# Patient Record
Sex: Female | Born: 2000 | Race: White | Hispanic: No | Smoking: Former smoker
Health system: Southern US, Community
[De-identification: ages and names within clinical notes are randomized; demographics above are authoritative.]

## PROBLEM LIST (undated history)

## (undated) ENCOUNTER — Inpatient Hospital Stay (HOSPITAL_COMMUNITY): Payer: Self-pay

## (undated) ENCOUNTER — Inpatient Hospital Stay: Payer: Self-pay

## (undated) DIAGNOSIS — L309 Dermatitis, unspecified: Secondary | ICD-10-CM

## (undated) DIAGNOSIS — N1330 Unspecified hydronephrosis: Secondary | ICD-10-CM

## (undated) DIAGNOSIS — L509 Urticaria, unspecified: Secondary | ICD-10-CM

## (undated) HISTORY — DX: Dermatitis, unspecified: L30.9

## (undated) HISTORY — DX: Urticaria, unspecified: L50.9

## (undated) HISTORY — PX: FOOT SURGERY: SHX648

---
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
## 2019-02-25 ENCOUNTER — Other Ambulatory Visit: Payer: Self-pay | Admitting: Adult Health

## 2019-02-25 MED ORDER — MEDROXYPROGESTERONE ACETATE 150 MG/ML IM SUSP: 150.0000 mg | INTRAMUSCULAR | 3 refills | Status: DC

## 2019-05-25 ENCOUNTER — Encounter: Payer: Self-pay | Admitting: Adult Health

## 2019-05-25 ENCOUNTER — Other Ambulatory Visit: Payer: Self-pay

## 2019-05-25 ENCOUNTER — Ambulatory Visit (INDEPENDENT_AMBULATORY_CARE_PROVIDER_SITE_OTHER): Payer: Medicaid Other | Admitting: Adult Health

## 2019-05-25 VITALS — Ht 61.0 in

## 2019-05-25 DIAGNOSIS — Z202 Contact with and (suspected) exposure to infections with a predominantly sexual mode of transmission: Secondary | ICD-10-CM

## 2019-05-25 MED ORDER — AZITHROMYCIN 250 MG PO TABS: ORAL_TABLET | ORAL | 0 refills | Status: DC

## 2019-05-25 NOTE — Addendum Note (Signed)
Addended by: Johnna Acosta on: 05/25/2019 04:16 PM   Modules accepted: Orders

## 2019-05-25 NOTE — Progress Notes (Addendum)
[facility] [address] [place], [place] [address]  Internal MEDICINE  Telephone Visit  Patient Name: Jamie Escobar  [id number]  [id number]  Date of Service: 05/25/2019  I connected with the patient at 242 by telephone and verified the patients identity using two identifiers.   I discussed the limitations, risks, security and privacy concerns of performing an evaluation and management service by telephone and the availability of in person appointments. I also discussed with the patient that there may be a patient responsible charge related to the service.  The patient expressed understanding and agrees to proceed.    Chief Complaint  Patient presents with  . Telephone Assessment  . Telephone Screen  . SEXUALLY TRANSMITTED DISEASE    HPI  Pt seen via telephone. She reports her sexual partner found out today that he tested positive for chlamydia.  He is being treated today, and she would like treatment also.  She denies any symptoms, and reports only one partner.     Current Medication: Outpatient Encounter Medications as of 05/25/2019  Medication Sig  . Cetirizine HCl 10 MG CAPS Take 1 capsule (10 mg total) by mouth daily.  . clobetasol ointment (TEMOVATE) 0.05 % Apply 1 application topically 2 (two) times daily.  . fluticasone (FLONASE) 50 MCG/ACT nasal spray Place 2 sprays into both nostrils daily.  . medroxyPROGESTERone (DEPO-PROVERA) 150 MG/ML injection Inject 1 mL (150 mg total) into the muscle every 3 (three) months.  . pantoprazole (PROTONIX) 40 MG tablet Take 1 tablet (40 mg total) by mouth 2 (two) times daily.  Marland Kitchen azithromycin (ZITHROMAX) 250 MG tablet TAKE 1000MG  PO ONCE  . [DISCONTINUED] oseltamivir (TAMIFLU) 75 MG capsule Take 1 capsule (75 mg total) by mouth daily. (Patient not taking: Reported on 05/25/2019)   No facility-administered encounter medications on file as of 05/25/2019.    Surgical History: Past Surgical History:  Procedure Laterality  Date  . FOOT SURGERY Right     Medical History: Past Medical History:  Diagnosis Date  . Hydronephrosis     Family History: Family History  Problem Relation Age of Onset  . Cancer Father        brain   . Diabetes Maternal Grandmother   . Heart disease Maternal Grandmother     Social History   Socioeconomic History  . Marital status: Single    Spouse name: Not on file  . Number of children: Not on file  . Years of education: Not on file  . Highest education level: Not on file  Occupational History  . Not on file  Tobacco Use  . Smoking status: Current Every Day Smoker  . Smokeless tobacco: Never Used  Substance and Sexual Activity  . Alcohol use: No  . Drug use: Not Currently    Types: Marijuana  . Sexual activity: Not Currently    Birth control/protection: None, Implant  Other Topics Concern  . Not on file  Social History Narrative  . Not on file   Social Determinants of Health   Financial Resource Strain:   . Difficulty of Paying Living Expenses: Not on file  Food Insecurity:   . Worried About 05/27/2019 in the Last Year: Not on file  . Ran Out of Food in the Last Year: Not on file  Transportation Needs:   . Lack of Transportation (Medical): Not on file  . Lack of Transportation (Non-Medical): Not on file  Physical Activity:   . Days of Exercise per Week: Not on  file  . Minutes of Exercise per Session: Not on file  Stress:   . Feeling of Stress : Not on file  Social Connections:   . Frequency of Communication with Friends and Family: Not on file  . Frequency of Social Gatherings with Friends and Family: Not on file  . Attends Religious Services: Not on file  . Active Member of Clubs or Organizations: Not on file  . Attends Archivist Meetings: Not on file  . Marital Status: Not on file  Intimate Partner Violence:   . Fear of Current or Ex-Partner: Not on file  . Emotionally Abused: Not on file  . Physically Abused: Not on file   . Sexually Abused: Not on file      Review of Systems  Constitutional: Negative for chills, fatigue and unexpected weight change.  HENT: Negative for congestion, rhinorrhea, sneezing and sore throat.   Eyes: Negative for photophobia, pain and redness.  Respiratory: Negative for cough, chest tightness and shortness of breath.   Cardiovascular: Negative for chest pain and palpitations.  Gastrointestinal: Negative for abdominal pain, constipation, diarrhea, nausea and vomiting.  Endocrine: Negative.   Genitourinary: Negative for dysuria and frequency.  Musculoskeletal: Negative for arthralgias, back pain, joint swelling and neck pain.  Skin: Negative for rash.  Allergic/Immunologic: Negative.   Neurological: Negative for tremors and numbness.  Hematological: Negative for adenopathy. Does not bruise/bleed easily.  Psychiatric/Behavioral: Negative for behavioral problems and sleep disturbance. The patient is not nervous/anxious.     Vital Signs: Ht 5\' 1"  (1.549 m)   BMI 24.56 kg/m    Observation/Objective:  Well sounding, NAD noted.   Assessment/Plan: 1. Exposure to sexually transmitted disease (STD) Pt will take 1 Gram PO azithromycin.  Encouraged no sexual activity for two weeks.  Offered retesting in a few weeks.  - azithromycin (ZITHROMAX) 250 MG tablet; TAKE 1000MG  PO ONCE  Dispense: 4 tablet; Refill: 0 RX canceled and sent to [facility] on [address] in [place]  General Counseling: Jamie Escobar verbalizes understanding of the findings of today's phone visit and agrees with plan of treatment. I have discussed any further diagnostic evaluation that may be needed or ordered today. We also reviewed her medications today. she has been encouraged to call the office with any questions or concerns that should arise related to todays visit.    No orders of the defined types were placed in this encounter.   Meds ordered this encounter  Medications  . azithromycin (ZITHROMAX) 250  MG tablet    Sig: TAKE 1000MG  PO ONCE    Dispense:  4 tablet    Refill:  0    Time spent:15 Minutes    Orson Gear AGNP-C Internal medicine

## 2019-06-28 ENCOUNTER — Ambulatory Visit (INDEPENDENT_AMBULATORY_CARE_PROVIDER_SITE_OTHER): Payer: Medicaid Other | Admitting: Adult Health

## 2019-06-28 ENCOUNTER — Other Ambulatory Visit: Payer: Self-pay

## 2019-06-28 ENCOUNTER — Encounter: Payer: Self-pay | Admitting: Adult Health

## 2019-06-28 VITALS — BP 104/61 | HR 78 | Temp 97.4°F | Resp 16 | Ht 61.0 in | Wt 143.2 lb

## 2019-06-28 DIAGNOSIS — Z3042 Encounter for surveillance of injectable contraceptive: Secondary | ICD-10-CM | POA: Diagnosis not present

## 2019-06-28 DIAGNOSIS — Z202 Contact with and (suspected) exposure to infections with a predominantly sexual mode of transmission: Secondary | ICD-10-CM | POA: Diagnosis not present

## 2019-06-28 MED ORDER — MEDROXYPROGESTERONE ACETATE 150 MG/ML IM SUSP: 150.0000 mg | INTRAMUSCULAR | 3 refills | Status: DC

## 2019-06-28 NOTE — Progress Notes (Signed)
[facility] [address] [place], [place] [address]  Internal MEDICINE  Office Visit Note  Patient Name: Jamie Escobar  [id number]  [id number]  Date of Service: 06/28/2019  Chief Complaint  Patient presents with  . Follow-up    HPI  Pt is here for follow up on chlamydia infection.  She took azithromycin and will be rechecked at this time.  She denies any symptoms or issues at this time.  Will order recheck today.     Current Medication: Outpatient Encounter Medications as of 06/28/2019  Medication Sig  . azithromycin (ZITHROMAX) 250 MG tablet Take 1000mg  (4 tablets) PO, once.  . Cetirizine HCl 10 MG CAPS Take 1 capsule (10 mg total) by mouth daily.  . clobetasol ointment (TEMOVATE) 0.05 % Apply 1 application topically 2 (two) times daily.  . fluticasone (FLONASE) 50 MCG/ACT nasal spray Place 2 sprays into both nostrils daily.  . medroxyPROGESTERone (DEPO-PROVERA) 150 MG/ML injection Inject 1 mL (150 mg total) into the muscle every 3 (three) months.  . pantoprazole (PROTONIX) 40 MG tablet Take 1 tablet (40 mg total) by mouth 2 (two) times daily.  . [DISCONTINUED] medroxyPROGESTERone (DEPO-PROVERA) 150 MG/ML injection Inject 1 mL (150 mg total) into the muscle every 3 (three) months.   No facility-administered encounter medications on file as of 06/28/2019.    Surgical History: Past Surgical History:  Procedure Laterality Date  . FOOT SURGERY Right     Medical History: Past Medical History:  Diagnosis Date  . Hydronephrosis     Family History: Family History  Problem Relation Age of Onset  . Cancer Father        brain   . Diabetes Maternal Grandmother   . Heart disease Maternal Grandmother     Social History   Socioeconomic History  . Marital status: Single    Spouse name: Not on file  . Number of children: Not on file  . Years of education: Not on file  . Highest education level: Not on file  Occupational History  . Not on file  Tobacco  Use  . Smoking status: Current Every Day Smoker    Types: E-cigarettes  . Smokeless tobacco: Never Used  Substance and Sexual Activity  . Alcohol use: No  . Drug use: Not Currently    Types: Marijuana  . Sexual activity: Not Currently    Birth control/protection: None, Implant  Other Topics Concern  . Not on file  Social History Narrative  . Not on file   Social Determinants of Health   Financial Resource Strain:   . Difficulty of Paying Living Expenses: Not on file  Food Insecurity:   . Worried About 06/30/2019 in the Last Year: Not on file  . Ran Out of Food in the Last Year: Not on file  Transportation Needs:   . Lack of Transportation (Medical): Not on file  . Lack of Transportation (Non-Medical): Not on file  Physical Activity:   . Days of Exercise per Week: Not on file  . Minutes of Exercise per Session: Not on file  Stress:   . Feeling of Stress : Not on file  Social Connections:   . Frequency of Communication with Friends and Family: Not on file  . Frequency of Social Gatherings with Friends and Family: Not on file  . Attends Religious Services: Not on file  . Active Member of Clubs or Organizations: Not on file  . Attends Programme researcher, broadcasting/film/video Meetings: Not on file  .  Marital Status: Not on file  Intimate Partner Violence:   . Fear of Current or Ex-Partner: Not on file  . Emotionally Abused: Not on file  . Physically Abused: Not on file  . Sexually Abused: Not on file      Review of Systems  Constitutional: Negative for chills, fatigue and unexpected weight change.  HENT: Negative for congestion, rhinorrhea, sneezing and sore throat.   Eyes: Negative for photophobia, pain and redness.  Respiratory: Negative for cough, chest tightness and shortness of breath.   Cardiovascular: Negative for chest pain and palpitations.  Gastrointestinal: Negative for abdominal pain, constipation, diarrhea, nausea and vomiting.  Endocrine: Negative.   Genitourinary:  Negative for dysuria and frequency.  Musculoskeletal: Negative for arthralgias, back pain, joint swelling and neck pain.  Skin: Negative for rash.  Allergic/Immunologic: Negative.   Neurological: Negative for tremors and numbness.  Hematological: Negative for adenopathy. Does not bruise/bleed easily.  Psychiatric/Behavioral: Negative for behavioral problems and sleep disturbance. The patient is not nervous/anxious.     Vital Signs: BP 104/61   Pulse 78   Temp (!) 97.4 F (36.3 C)   Resp 16   Ht 5\' 1"  (1.549 m)   Wt 143 lb 3.2 oz (65 kg)   SpO2 99%   BMI 27.06 kg/m    Physical Exam Vitals and nursing note reviewed.  Constitutional:      General: She is not in acute distress.    Appearance: She is well-developed. She is not diaphoretic.  HENT:     Head: Normocephalic and atraumatic.     Mouth/Throat:     Pharynx: No oropharyngeal exudate.  Eyes:     Pupils: Pupils are equal, round, and reactive to light.  Neck:     Thyroid: No thyromegaly.     Vascular: No JVD.     Trachea: No tracheal deviation.  Cardiovascular:     Rate and Rhythm: Normal rate and regular rhythm.     Heart sounds: Normal heart sounds. No murmur. No friction rub. No gallop.   Pulmonary:     Effort: Pulmonary effort is normal. No respiratory distress.     Breath sounds: Normal breath sounds. No wheezing or rales.  Chest:     Chest wall: No tenderness.  Abdominal:     Palpations: Abdomen is soft.     Tenderness: There is no abdominal tenderness. There is no guarding.  Musculoskeletal:        General: Normal range of motion.     Cervical back: Normal range of motion and neck supple.  Lymphadenopathy:     Cervical: No cervical adenopathy.  Skin:    General: Skin is warm and dry.  Neurological:     Mental Status: She is alert and oriented to person, place, and time.     Cranial Nerves: No cranial nerve deficit.  Psychiatric:        Behavior: Behavior normal.        Thought Content: Thought  content normal.        Judgment: Judgment normal.    Assessment/Plan: 1. Exposure to sexually transmitted disease (STD) Recheck urine for chlamydia  - Chlamydia/Gonococcus/Trichomonas, NAA  2. Encounter for surveillance of injectable contraceptive Refilled Depo-provera at this time.  - medroxyPROGESTERone (DEPO-PROVERA) 150 MG/ML injection; Inject 1 mL (150 mg total) into the muscle every 3 (three) months.  Dispense: 1 mL; Refill: 3  General Counseling: Kinley verbalizes understanding of the findings of todays visit and agrees with plan of treatment. I have discussed  any further diagnostic evaluation that may be needed or ordered today. We also reviewed her medications today. she has been encouraged to call the office with any questions or concerns that should arise related to todays visit.    Orders Placed This Encounter  Procedures  . Chlamydia/Gonococcus/Trichomonas, NAA    Meds ordered this encounter  Medications  . medroxyPROGESTERone (DEPO-PROVERA) 150 MG/ML injection    Sig: Inject 1 mL (150 mg total) into the muscle every 3 (three) months.    Dispense:  1 mL    Refill:  3    Time spent: 15 Minutes   This patient was seen by Blima Ledger AGNP-C in Collaboration with Dr Lyndon Code as a part of collaborative care agreement     Johnna Acosta AGNP-C Internal medicine

## 2019-07-01 LAB — CHLAMYDIA/GONOCOCCUS/TRICHOMONAS, NAA
Chlamydia by NAA: NEGATIVE
Gonococcus by NAA: NEGATIVE
Trich vag by NAA: NEGATIVE

## 2019-12-06 ENCOUNTER — Other Ambulatory Visit: Payer: Self-pay

## 2019-12-06 ENCOUNTER — Emergency Department
Admission: EM | Admit: 2019-12-06 | Discharge: 2019-12-06 | Disposition: A | Discharge status: Home / Self Care | Payer: Medicaid Other | Attending: Emergency Medicine | Admitting: Emergency Medicine

## 2019-12-06 ENCOUNTER — Encounter: Payer: Self-pay | Admitting: Emergency Medicine

## 2019-12-06 DIAGNOSIS — T43225A Adverse effect of selective serotonin reuptake inhibitors, initial encounter: Secondary | ICD-10-CM | POA: Diagnosis not present

## 2019-12-06 DIAGNOSIS — T7840XA Allergy, unspecified, initial encounter: Secondary | ICD-10-CM | POA: Diagnosis not present

## 2019-12-06 DIAGNOSIS — Z79899 Other long term (current) drug therapy: Secondary | ICD-10-CM | POA: Diagnosis not present

## 2019-12-06 DIAGNOSIS — F17210 Nicotine dependence, cigarettes, uncomplicated: Secondary | ICD-10-CM | POA: Insufficient documentation

## 2019-12-06 DIAGNOSIS — L299 Pruritus, unspecified: Secondary | ICD-10-CM | POA: Diagnosis not present

## 2019-12-06 DIAGNOSIS — R21 Rash and other nonspecific skin eruption: Secondary | ICD-10-CM | POA: Diagnosis present

## 2019-12-06 DIAGNOSIS — L509 Urticaria, unspecified: Secondary | ICD-10-CM | POA: Insufficient documentation

## 2019-12-06 MED ORDER — PREDNISONE 50 MG PO TABS: 50.0000 mg | ORAL_TABLET | Freq: Every day | ORAL | 0 refills | Status: DC

## 2019-12-06 NOTE — ED Provider Notes (Signed)
[facility] Emergency Department Provider Note   ____________________________________________    I have reviewed the triage vital signs and the nursing notes.   HISTORY  Chief Complaint Rash     HPI Jamie Escobar is a 19 y.o. female with history as noted below who was started on Zoloft last week but developed an itchy rash primarily to her hands the day afterwards.  She has since discontinued the medication.  No history of allergic reactions in the past.  She reports continued itching diffusely with varying red rash consistent with hives.  Her lip felt swollen yesterday, better now.  No difficulty breathing.  Past Medical History:  Diagnosis Date  . Hydronephrosis     Patient Active Problem List   Diagnosis Date Noted  . Gastroesophageal reflux disease 10/23/2017  . Menorrhagia with irregular cycle 10/23/2017  . Skin lesion 10/23/2017  . Nausea 07/07/2016  . Annual physical exam 01/29/2016    Past Surgical History:  Procedure Laterality Date  . FOOT SURGERY Right     Prior to Admission medications   Medication Sig Start Date End Date Taking? Authorizing Provider  Cetirizine HCl 10 MG CAPS Take 1 capsule (10 mg total) by mouth daily. 11/10/18   Kendell Bane, NP  clobetasol ointment (TEMOVATE) 1.85 % Apply 1 application topically 2 (two) times daily. 12/01/18   Kendell Bane, NP  fluticasone (FLONASE) 50 MCG/ACT nasal spray Place 2 sprays into both nostrils daily. 11/10/18   Kendell Bane, NP  medroxyPROGESTERone (DEPO-PROVERA) 150 MG/ML injection Inject 1 mL (150 mg total) into the muscle every 3 (three) months. 06/28/19   Kendell Bane, NP  pantoprazole (PROTONIX) 40 MG tablet Take 1 tablet (40 mg total) by mouth 2 (two) times daily. 07/02/18   Kendell Bane, NP  predniSONE (DELTASONE) 50 MG tablet Take 1 tablet (50 mg total) by mouth daily with breakfast. 12/06/19   Lavonia Drafts, MD     Allergies Patient has no known  allergies.  Family History  Problem Relation Age of Onset  . Cancer Father        brain   . Diabetes Maternal Grandmother   . Heart disease Maternal Grandmother     Social History Social History   Tobacco Use  . Smoking status: Current Every Day Smoker    Types: E-cigarettes  . Smokeless tobacco: Never Used  Vaping Use  . Vaping Use: Some days  Substance Use Topics  . Alcohol use: No  . Drug use: Not Currently    Types: Marijuana    Review of Systems  Constitutional: No fever/chills  ENT: No throat swelling   Gastrointestinal: No nausea, no vomiting.    Musculoskeletal: Negative for joint swelling Skin: Positive for rash     ____________________________________________   PHYSICAL EXAM:  VITAL SIGNS: ED Triage Vitals  Enc Vitals Group     BP 12/06/19 0732 116/66     Pulse Rate 12/06/19 0732 80     Resp 12/06/19 0732 17     Temp 12/06/19 0732 98.4 F (36.9 C)     Temp Source 12/06/19 0732 Oral     SpO2 12/06/19 0732 98 %     Weight 12/06/19 0729 64.9 kg (143 lb)     Height 12/06/19 0729 1.549 m (5\' 1" )     Head Circumference --      Peak Flow --      Pain Score 12/06/19 0729 0     Pain Loc --  Pain Edu? --      Excl. in [place]? --      Constitutional: Alert and oriented. No acute distress. Pleasant and interactive  Nose: No congestion/rhinnorhea. Mouth/Throat: Mucous membranes are moist.  Pharynx normal, uvula normal Cardiovascular: Normal rate, regular rhythm.  Respiratory: Normal respiratory effort.  No retractions.  Musculoskeletal: No joint swelling Neurologic:  Normal speech and language. No gross focal neurologic deficits are appreciated.   Skin:  Skin is warm, dry and intact.  Urticaria noted   ____________________________________________   LABS (all labs ordered are listed, but only abnormal results are displayed)  Labs Reviewed - No data to  display ____________________________________________  EKG   ____________________________________________  RADIOLOGY   ____________________________________________   PROCEDURES  Procedure(s) performed: No  Procedures   Critical Care performed: No ____________________________________________   INITIAL IMPRESSION / ASSESSMENT AND PLAN / ED COURSE  Pertinent labs & imaging results that were available during my care of the patient were reviewed by me and considered in my medical decision making (see chart for details).  Patient presents with itchy rash as noted above.  Differential includes urticaria versus erythema multiforme versus drug rash  Most consistent with urticaria given pruritus and rapidly changing rash.  Will start prednisone burst, continue Benadryl Zyrtec   ____________________________________________   FINAL CLINICAL IMPRESSION(S) / ED DIAGNOSES  Final diagnoses:  Allergic reaction, initial encounter  Urticaria      NEW MEDICATIONS STARTED DURING THIS VISIT:  Discharge Medication List as of 12/06/2019  9:05 AM    START taking these medications   Details  predniSONE (DELTASONE) 50 MG tablet Take 1 tablet (50 mg total) by mouth daily with breakfast., Starting Tue 12/06/2019, Normal         Note:  This document was prepared using Dragon voice recognition software and may include unintentional dictation errors.   Lavonia Drafts, MD 12/06/19 1304

## 2019-12-06 NOTE — ED Notes (Signed)
See triage note  Presents with rash  States she started Zoloft last week  Developed rash couple days after  Stopping take meds on Sunday  conts to have rash/hives  Min swelling noted to lip  No resp issues noted

## 2019-12-06 NOTE — ED Triage Notes (Signed)
Pt reports rash/hives on her arms, legs and hands since Friday. Pt reports not sure what she is having a reaction to. Pt reports soaps, clothes, detergents or anything else. Pt reports she did start a new medication on Wednesday and her dr told her to stop taking it incase that was causing the reaction so she has not taken it in 2 days. Pt denies SOB or other concerns. Pt reports areas itch.

## 2020-01-31 NOTE — Progress Notes (Signed)
New Patient Note  RE: Jamie Escobar MRN: [id number] DOB: 07/28/2000 Date of Office Visit: 02/01/2020  Referring provider: Roderick PeeElmore, Kevin A, PA Primary care provider: Roderick PeeElmore, Kevin A, PA  Chief Complaint: Urticaria and Allergic Rhinitis   History of Present Illness: I had the pleasure of seeing Jamie Escobar for initial evaluation at the [facility] on 02/01/2020. She is a 19 y.o. female, who is referred here by Roderick PeeElmore, Kevin A, PA for the evaluation of hives.  Patient was started on a new anxiety medication (Zoloft) in July and 3 days afterwards she broke out in hives for 1-2 weeks.  She stopped Zoloft. She was treated with hydroxyzine for 5 days which helped.   She was then switched to Lexapro in August and broke out in a rash 2 days after taking it. Patient stopped the medication and took about 4 days for the rash to resolve with zyrtec.   Since then patient has not had anymore itching, rash or swelling.   The rash mainly occurred on her legs, feet, arms. Describes them as raised, itchy, red bumps. Individual rashes lasts about 15 minutes. No ecchymosis upon resolution. Associated symptoms include: lip swelling x 3 but no respiratory compromise. Suspected triggers are possibly the new medications. Denies any fevers, chills, foods, personal care products or recent infections. She has tried the following therapies: zyrtec and hydroxyzine with good benefit. Systemic steroids yes. Currently on zyrtec 10mg  daily.  Previous work up includes: none. Previous history of rash/hives: none.  Reviewed images on the phone - raised, papular erythematous rash on foot and buttocks noted.  12/06/2019 ER visit: "Jamie Escobar is a 19 y.o. female with history as noted below who was started on Zoloft last week but developed an itchy rash primarily to her hands the day afterwards.  She has since discontinued the medication.  No history of allergic reactions in the past.  She  reports continued itching diffusely with varying red rash consistent with hives.  Her lip felt swollen yesterday, better now.  No difficulty breathing."  Assessment and Plan: Jamie Escobar is a 19 y.o. female with: Drug reaction Patient started on Zoloft in July and 3 days afterwards broke out in pruritic rash lasting for 1 to 2 weeks.  Treated with hydroxyzine with good benefit.  She was then started on Lexapro in August and 2 days afterwards broke out in a similar pruritic rash as Zoloft.  She stopped Lexapro and the rash resolved within 5 days.  3 episodes of lip angioedema with no respiratory compromise.  Since she stopped her SSRI medications no additional pruritic rash or swelling.  No prior history of rash/hives/angioedema.  Discussed with patient that there are no "allergy" testing available for SSRI medications such as Zoloft and Lexapro.  Her clinical history suggests a non-IgE mediated adverse reaction to these 2 drugs.   Recommend to try a different class of medications that is not an SSRI to control her mood. No need for in office drug challenge as symptoms occurred a few days after initial ingestion.   If patient develops rashes or angioedema without taking SSRI medications then will need to do some additional work up at that time.  Chronic rhinitis Perennial rhinitis symptoms for the last 5 years with worsening in the spring.  Tried Zyrtec and Flonase with good benefit.  No previous ENT/allergy evaluation.  Patient would like testing for environmental allergies.  Today's skin testing showed: negative to indoor/outdoor allergens.  May use  over the counter antihistamines such as Zyrtec (cetirizine), Claritin (loratadine), Allegra (fexofenadine), or Xyzal (levocetirizine) daily as needed.  May use Flonase (fluticasone) nasal spray 1 spray per nostril twice a day as needed for nasal congestion.  Return if symptoms worsen or fail to improve.  Other allergy screening: Asthma: no Rhino  conjunctivitis: yes  She reports symptoms of nasal congestion, sore throat. Symptoms have been going on for 5 years. The symptoms are present all year around with worsening in spring. Anosmia: no. Headache: sometimes. She has used zyrtec, fluticasone with fair improvement in symptoms. Sinus infections: yes. Previous work up includes: none. Previous ENT evaluation: no. Previous sinus imaging: no. History of nasal polyps: no. History of reflux: not taking any medications for this.  Food allergy: no Medication allergy: no Hymenoptera allergy: no Eczema: yes History of recurrent infections suggestive of immunodeficency: no  Diagnostics: Skin Testing: Environmental allergy panel. Negative to indoor/outdoor allergens. Results discussed with patient/family.  Airborne Adult Perc - 02/01/20 1509    Time Antigen Placed 1509    Allergen Manufacturer Waynette Buttery    Location Back    Number of Test 59    Panel 1 Select    1. Control-Buffer 50% Glycerol Negative    2. Control-Histamine 1 mg/ml 2+    3. Albumin saline Negative    4. Bahia Negative    5. French Southern Territories Negative    6. Johnson Negative    7. Kentucky Blue Negative    8. Meadow Fescue Negative    9. Perennial Rye Negative    10. Sweet Vernal Negative    11. Timothy Negative    12. Cocklebur Negative    13. Burweed Marshelder Negative    14. Ragweed, short Negative    15. Ragweed, Giant Negative    16. Plantain,  English Negative    17. Lamb's Quarters Negative    18. Sheep Sorrell Negative    19. Rough Pigweed Negative    20. Marsh Elder, Rough Negative    21. Mugwort, Common Negative    22. Ash mix Negative    23. Birch mix Negative    24. Beech American Negative    25. Box, Elder Negative    26. Cedar, red Negative    27. Cottonwood, Guinea-Bissau Negative    28. Elm mix Negative    29. Hickory Negative    30. Maple mix Negative    31. Oak, Guinea-Bissau mix Negative    32. Pecan Pollen Negative    33. Pine mix Negative    34. Sycamore  Eastern Negative    35. Walnut, Black Pollen Negative    36. Alternaria alternata Negative    37. Cladosporium Herbarum Negative    38. Aspergillus mix Negative    39. Penicillium mix Negative    40. Bipolaris sorokiniana (Helminthosporium) Negative    41. Drechslera spicifera (Curvularia) Negative    42. Mucor plumbeus Negative    43. Fusarium moniliforme Negative    44. Aureobasidium pullulans (pullulara) Negative    45. Rhizopus oryzae Negative    46. Botrytis cinera Negative    47. Epicoccum nigrum Negative    48. Phoma betae Negative    49. Candida Albicans Negative    50. Trichophyton mentagrophytes Negative    51. Mite, D Farinae  5,000 AU/ml Negative    52. Mite, D Pteronyssinus  5,000 AU/ml Negative    53. Cat Hair 10,000 BAU/ml Negative    54.  Dog Epithelia Negative    55. Mixed Feathers  Negative    56. Horse Epithelia Negative    57. Cockroach, German Negative    58. Mouse Negative    59. Tobacco Leaf Negative           Past Medical History: Patient Active Problem List   Diagnosis Date Noted  . Drug reaction 02/01/2020  . Chronic rhinitis 02/01/2020  . Gastroesophageal reflux disease 10/23/2017  . Menorrhagia with irregular cycle 10/23/2017  . Skin lesion 10/23/2017  . Nausea 07/07/2016  . Annual physical exam 01/29/2016   Past Medical History:  Diagnosis Date  . Eczema   . Hydronephrosis   . Urticaria    Past Surgical History: Past Surgical History:  Procedure Laterality Date  . FOOT SURGERY Right    Medication List:  Current Outpatient Medications  Medication Sig Dispense Refill  . Cetirizine HCl 10 MG CAPS Take 1 capsule (10 mg total) by mouth daily. 90 capsule 1  . medroxyPROGESTERone (DEPO-PROVERA) 150 MG/ML injection Inject 1 mL (150 mg total) into the muscle every 3 (three) months. 1 mL 3  . triamcinolone ointment (KENALOG) 0.5 % Apply to affected areas twice daily     No current facility-administered medications for this visit.    Allergies: No Known Allergies Social History: Social History   Socioeconomic History  . Marital status: Single    Spouse name: Not on file  . Number of children: Not on file  . Years of education: Not on file  . Highest education level: Not on file  Occupational History  . Not on file  Tobacco Use  . Smoking status: Current Every Day Smoker    Types: E-cigarettes  . Smokeless tobacco: Never Used  Vaping Use  . Vaping Use: Some days  Substance and Sexual Activity  . Alcohol use: No  . Drug use: Not Currently    Types: Marijuana  . Sexual activity: Not Currently    Birth control/protection: None, Implant  Other Topics Concern  . Not on file  Social History Narrative  . Not on file   Social Determinants of Health   Financial Resource Strain:   . Difficulty of Paying Living Expenses: Not on file  Food Insecurity:   . Worried About Programme researcher, broadcasting/film/video in the Last Year: Not on file  . Ran Out of Food in the Last Year: Not on file  Transportation Needs:   . Lack of Transportation (Medical): Not on file  . Lack of Transportation (Non-Medical): Not on file  Physical Activity:   . Days of Exercise per Week: Not on file  . Minutes of Exercise per Session: Not on file  Stress:   . Feeling of Stress : Not on file  Social Connections:   . Frequency of Communication with Friends and Family: Not on file  . Frequency of Social Gatherings with Friends and Family: Not on file  . Attends Religious Services: Not on file  . Active Member of Clubs or Organizations: Not on file  . Attends Banker Meetings: Not on file  . Marital Status: Not on file   Lives in an apartment. Smoking: vapes Occupation: PCA  Environmental History: Water Damage/mildew in the house: yes Carpet in the family room: yes Carpet in the bedroom: yes Heating: electric Cooling: central Pet: no  Family History: Family History  Problem Relation Age of Onset  . Cancer Father        brain    . Diabetes Maternal Grandmother   . Heart disease Maternal Grandmother   .  Allergic rhinitis Neg Hx   . Angioedema Neg Hx   . Asthma Neg Hx   . Atopy Neg Hx   . Eczema Neg Hx   . Immunodeficiency Neg Hx   . Urticaria Neg Hx    Review of Systems  Constitutional: Negative for appetite change, chills, fever and unexpected weight change.  HENT: Negative for congestion and rhinorrhea.   Eyes: Negative for itching.  Respiratory: Negative for cough, chest tightness, shortness of breath and wheezing.   Cardiovascular: Negative for chest pain.  Gastrointestinal: Negative for abdominal pain.  Genitourinary: Negative for difficulty urinating.  Skin: Positive for rash.  Allergic/Immunologic: Negative for environmental allergies and food allergies.  Neurological: Negative for headaches.   Objective: BP 102/62   Pulse 67   Temp 98.7 F (37.1 C) (Temporal)   Resp 16   Ht 5\' 1"  (1.549 m)   Wt 167 lb 3.2 oz (75.8 kg)   SpO2 98%   BMI 31.59 kg/m  Body mass index is 31.59 kg/m. Physical Exam Vitals and nursing note reviewed.  Constitutional:      Appearance: Normal appearance. She is well-developed.  HENT:     Head: Normocephalic and atraumatic.     Right Ear: External ear normal.     Left Ear: External ear normal.     Nose: Nose normal.     Mouth/Throat:     Mouth: Mucous membranes are moist.     Pharynx: Oropharynx is clear.  Eyes:     Conjunctiva/sclera: Conjunctivae normal.  Cardiovascular:     Rate and Rhythm: Normal rate and regular rhythm.     Heart sounds: Normal heart sounds. No murmur heard.  No friction rub. No gallop.   Pulmonary:     Effort: Pulmonary effort is normal.     Breath sounds: Normal breath sounds. No wheezing, rhonchi or rales.  Musculoskeletal:     Cervical back: Neck supple.  Skin:    General: Skin is warm.     Findings: No rash.  Neurological:     Mental Status: She is alert and oriented to person, place, and time.  Psychiatric:         Behavior: Behavior normal.    The plan was reviewed with the patient/family, and all questions/concerned were addressed.  It was my pleasure to see Miosotis today and participate in her care. Please feel free to contact me with any questions or concerns.  Sincerely,  Jamie Pound, DO Allergy & Immunology  [facility] office: [phone] [facility] office: [phone] [place] office: [phone]

## 2020-02-01 ENCOUNTER — Other Ambulatory Visit: Payer: Self-pay

## 2020-02-01 ENCOUNTER — Encounter: Payer: Self-pay | Admitting: Allergy

## 2020-02-01 ENCOUNTER — Ambulatory Visit (INDEPENDENT_AMBULATORY_CARE_PROVIDER_SITE_OTHER): Payer: Medicaid Other | Admitting: Allergy

## 2020-02-01 VITALS — BP 102/62 | HR 67 | Temp 98.7°F | Resp 16 | Ht 61.0 in | Wt 167.2 lb

## 2020-02-01 DIAGNOSIS — T50905D Adverse effect of unspecified drugs, medicaments and biological substances, subsequent encounter: Secondary | ICD-10-CM | POA: Diagnosis not present

## 2020-02-01 DIAGNOSIS — J310 Chronic rhinitis: Secondary | ICD-10-CM

## 2020-02-01 DIAGNOSIS — T50905A Adverse effect of unspecified drugs, medicaments and biological substances, initial encounter: Secondary | ICD-10-CM | POA: Insufficient documentation

## 2020-02-01 HISTORY — DX: Chronic rhinitis: J31.0

## 2020-02-01 NOTE — Assessment & Plan Note (Signed)
Perennial rhinitis symptoms for the last 5 years with worsening in the spring.  Tried Zyrtec and Flonase with good benefit.  No previous ENT/allergy evaluation.  Patient would like testing for environmental allergies.  Today's skin testing showed: negative to indoor/outdoor allergens.  May use over the counter antihistamines such as Zyrtec (cetirizine), Claritin (loratadine), Allegra (fexofenadine), or Xyzal (levocetirizine) daily as needed.  May use Flonase (fluticasone) nasal spray 1 spray per nostril twice a day as needed for nasal congestion.

## 2020-02-01 NOTE — Assessment & Plan Note (Signed)
Patient started on Zoloft in July and 3 days afterwards broke out in pruritic rash lasting for 1 to 2 weeks.  Treated with hydroxyzine with good benefit.  She was then started on Lexapro in August and 2 days afterwards broke out in a similar pruritic rash as Zoloft.  She stopped Lexapro and the rash resolved within 5 days.  3 episodes of lip angioedema with no respiratory compromise.  Since she stopped her SSRI medications no additional pruritic rash or swelling.  No prior history of rash/hives/angioedema.  Discussed with patient that there are no "allergy" testing available for SSRI medications such as Zoloft and Lexapro.  Her clinical history suggests a non-IgE mediated adverse reaction to these 2 drugs.   Recommend to try a different class of medications that is not an SSRI to control her mood. No need for in office drug challenge as symptoms occurred a few days after initial ingestion.   If patient develops rashes or angioedema without taking SSRI medications then will need to do some additional work up at that time.

## 2020-02-01 NOTE — Patient Instructions (Addendum)
Rash/swelling:  There is no "allergy" testing available for SSRI medications such as Zoloft and Lexapro.  Given your history and the fact that your symptoms resolved once you stopped these 2 medications I recommend to try a different group of anxiety medications that is not in the SSRI group. You may have had some delayed reactions to these SSRI medications.    If you break out in rashes or have lip swelling without taking SSRI medications then will need to do some additional work up at that time.  Environmental allergies  Today's skin testing showed: negative to indoor/outdoor allergens.  May use over the counter antihistamines such as Zyrtec (cetirizine), Claritin (loratadine), Allegra (fexofenadine), or Xyzal (levocetirizine) daily as needed.  May use Flonase (fluticasone) nasal spray 1 spray per nostril twice a day as needed for nasal congestion.   Follow up as needed.

## 2020-10-17 ENCOUNTER — Encounter: Payer: Self-pay | Admitting: Radiology

## 2020-11-08 ENCOUNTER — Telehealth: Payer: Self-pay | Admitting: *Deleted

## 2020-11-08 ENCOUNTER — Other Ambulatory Visit: Payer: Self-pay | Admitting: Obstetrics & Gynecology

## 2020-11-08 MED ORDER — DOXYLAMINE-PYRIDOXINE 10-10 MG PO TBEC: 2.0000 | DELAYED_RELEASE_TABLET | Freq: Every day | ORAL | 5 refills | Status: DC

## 2020-11-08 MED ORDER — PROMETHAZINE HCL 25 MG PO TABS: 25.0000 mg | ORAL_TABLET | Freq: Four times a day (QID) | ORAL | 2 refills | Status: DC | PRN

## 2020-11-08 NOTE — Telephone Encounter (Signed)
Pt called requesting meds for nausea has a New OB scheduled

## 2020-11-21 ENCOUNTER — Other Ambulatory Visit (HOSPITAL_COMMUNITY)
Admission: RE | Admit: 2020-11-21 | Discharge: 2020-11-21 | Disposition: A | Discharge status: Home / Self Care | Payer: Medicaid Other | Source: Ambulatory Visit | Attending: Family Medicine | Admitting: Family Medicine

## 2020-11-21 ENCOUNTER — Encounter: Payer: Self-pay | Admitting: Family Medicine

## 2020-11-21 ENCOUNTER — Other Ambulatory Visit: Payer: Self-pay

## 2020-11-21 ENCOUNTER — Ambulatory Visit (INDEPENDENT_AMBULATORY_CARE_PROVIDER_SITE_OTHER): Payer: Medicaid Other | Admitting: Family Medicine

## 2020-11-21 VITALS — BP 126/75 | HR 65 | Wt 175.8 lb

## 2020-11-21 DIAGNOSIS — Z124 Encounter for screening for malignant neoplasm of cervix: Secondary | ICD-10-CM | POA: Insufficient documentation

## 2020-11-21 DIAGNOSIS — O0990 Supervision of high risk pregnancy, unspecified, unspecified trimester: Secondary | ICD-10-CM

## 2020-11-21 DIAGNOSIS — O99210 Obesity complicating pregnancy, unspecified trimester: Secondary | ICD-10-CM | POA: Insufficient documentation

## 2020-11-21 DIAGNOSIS — O99211 Obesity complicating pregnancy, first trimester: Secondary | ICD-10-CM

## 2020-11-21 MED ORDER — PREPLUS 27-1 MG PO TABS: 1.0000 | ORAL_TABLET | Freq: Every day | ORAL | 13 refills | Status: DC

## 2020-11-21 MED ORDER — BLOOD PRESSURE KIT: 1.0000 | PACK | 0 refills | Status: DC

## 2020-11-21 MED ORDER — ASPIRIN EC 81 MG PO TBEC: 81.0000 mg | DELAYED_RELEASE_TABLET | Freq: Every day | ORAL | 2 refills | Status: DC

## 2020-11-21 NOTE — Progress Notes (Signed)
PHQ 9 4  GAD 7 3

## 2020-11-21 NOTE — Progress Notes (Signed)
INITIAL PRENATAL VISIT  Subjective:   Jamie Escobar is being seen today for her first obstetrical visit.  This is a planned pregnancy. This is a desired pregnancy.  She is at [redacted]w[redacted]d gestation by LMP. Her obstetrical history is significant for obesity. Relationship with FOB: spouse, living together. Patient does intend to breast feed. Pregnancy history fully reviewed.  Patient reports nausea.  Indications for ASA therapy (per uptodate) One of the following: Previous pregnancy with preeclampsia, especially early onset and with an adverse outcome No Multifetal gestation No Chronic hypertension No Type 1 or 2 diabetes mellitus No Chronic kidney disease No Autoimmune disease (antiphospholipid syndrome, systemic lupus erythematosus) No  Two or more of the following: Nulliparity No Obesity (body mass index >30 kg/m2) Yes Family history of preeclampsia in mother or sister No Age ?35 years No Sociodemographic characteristics (African American race, low socioeconomic level) Yes Personal risk factors (eg, previous pregnancy with low birth weight or small for gestational age infant, previous adverse pregnancy outcome [eg, stillbirth], interval >10 years between pregnancies) No  Indications for early GDM screening  First-degree relative with diabetes No BMI >30kg/m2 Yes Age > 25 Yes Previous birth of an infant weighing ?4000 g No Gestational diabetes mellitus in a previous pregnancy No Glycated hemoglobin ?5.7 percent (39 mmol/mol), impaired glucose tolerance, or impaired fasting glucose on previous testing No High-risk race/ethnicity (eg, African American, Latino, Native American, Asian American, Pacific Islander) No Previous stillbirth of unknown cause No Maternal birthweight > 9 lbs No History of cardiovascular disease No Hypertension or on therapy for hypertension No High-density lipoprotein cholesterol level <35 mg/dL (8.25 mmol/L) and/or a triglyceride level >250 mg/dL (0.53  mmol/L) No Polycystic ovary syndrome No Physical inactivity No Other clinical condition associated with insulin resistance (eg, severe obesity, acanthosis nigricans) No Current use of glucocorticoids No   Early screening tests: FBS, A1C, Random CBG, glucose challenge   Review of Systems:   Review of Systems  Objective:    Obstetric History OB History  Gravida Para Term Preterm AB Living  2 1 1     1   SAB IAB Ectopic Multiple Live Births        0 1    # Outcome Date GA Lbr Len/2nd Weight Sex Delivery Anes PTL Lv  2 Current           1 Term 11/03/15 [redacted]w[redacted]d 14:50 / 03:19 5 lb 12.4 oz (2.62 kg) M Vag-Spont EPI  LIV     Birth Comments: none    Past Medical History:  Diagnosis Date   Eczema    Hydronephrosis    Urticaria     Past Surgical History:  Procedure Laterality Date   FOOT SURGERY Right     Current Outpatient Medications on File Prior to Visit  Medication Sig Dispense Refill   Cetirizine HCl 10 MG CAPS Take 1 capsule (10 mg total) by mouth daily. 90 capsule 1   Doxylamine-Pyridoxine (DICLEGIS) 10-10 MG TBEC Take 2 tablets by mouth at bedtime. If symptoms persist, add one tablet in the morning and one in the afternoon 100 tablet 5   medroxyPROGESTERone (DEPO-PROVERA) 150 MG/ML injection Inject 1 mL (150 mg total) into the muscle every 3 (three) months. 1 mL 3   promethazine (PHENERGAN) 25 MG tablet Take 1 tablet (25 mg total) by mouth every 6 (six) hours as needed for nausea or vomiting. 30 tablet 2   triamcinolone ointment (KENALOG) 0.5 % Apply to affected areas twice daily  No current facility-administered medications on file prior to visit.    No Known Allergies  Social History:  reports that she has been smoking e-cigarettes. She has never used smokeless tobacco. She reports previous drug use. Drug: Marijuana. She reports that she does not drink alcohol.  Family History  Problem Relation Age of Onset   Cancer Father        brain    Diabetes Maternal  Grandmother    Heart disease Maternal Grandmother    Allergic rhinitis Neg Hx    Angioedema Neg Hx    Asthma Neg Hx    Atopy Neg Hx    Eczema Neg Hx    Immunodeficiency Neg Hx    Urticaria Neg Hx     The following portions of the patient's history were reviewed and updated as appropriate: allergies, current medications, past family history, past medical history, past social history, past surgical history and problem list.  Review of Systems Review of Systems    Physical Exam:  BP 126/75   Pulse 65   Wt 175 lb 12.8 oz (79.7 kg)   LMP 09/09/2020 (Exact Date)   BMI 33.22 kg/m  CONSTITUTIONAL: Well-developed, well-nourished female in no acute distress.  HENT:  Normocephalic, atraumatic, External right and left ear normal. Oropharynx is clear and moist EYES: Conjunctivae normal. No scleral icterus.  NECK: Normal range of motion, supple, no masses.  SKIN: Skin is warm and dry. No rash noted. Not diaphoretic. No erythema. No pallor. MUSCULOSKELETAL: Normal range of motion. No tenderness.  No cyanosis, clubbing, or edema.   NEUROLOGIC: Alert and oriented to person, place, and time. Normal muscle tone coordination.  PSYCHIATRIC: Normal mood and affect. Normal behavior. Normal judgment and thought content. CARDIOVASCULAR: Normal heart rate noted RESPIRATORY: Effort and breath sounds normal, no problems with respiration noted. BREASTS: Symmetric in size. No masses, skin changes, nipple drainage, or lymphadenopathy. ABDOMEN: Soft. No tenderness,  Fundal ht: below pelvic rim PELVIC: Normal appearing external genitalia; normal appearing vaginal mucosa and cervix.  No abnormal discharge noted.  Pap smear obtained.  Normal uterine size, no other palpable masses, no uterine or adnexal tenderness. FHR: 174  Performed bedside US to assure dating: CRL [redacted]w[redacted]d FHR 174 Visualized fetus well.     Assessment:    Pregnancy: G2P1001  1. Supervision of high risk pregnancy, antepartum - Cytology  - PAP - Genetic Screening - CBC/D/Plt+RPR+Rh+ABO+RubIgG... - [place] MFM OB DETAIL +14 WK; Future - aspirin EC 81 MG tablet; Take 1 tablet (81 mg total) by mouth daily. Take after 12 weeks for prevention of preeclampsia later in pregnancy  Dispense: 300 tablet; Refill: 2 - Prenatal Vit-Fe Fumarate-FA (PREPLUS) 27-1 MG TABS; Take 1 tablet by mouth daily.  Dispense: 30 tablet; Refill: 13  2. Cervical cancer screening - Cytology - PAP  3. Obesity affecting pregnancy in first trimester TWG=17 lb 12.8 oz (8.074 kg)      Plan:     Initial labs drawn. Prenatal vitamins. Problem list reviewed and updated. Reviewed in detail the nature of the practice with collaborative care between  Genetic screening discussed: NIPS/First trimester screen/Quad/AFP requested. Role of ultrasound in pregnancy discussed; Anatomy US: requested. Amniocentesis discussed: not indicated. Follow up in 4 weeks. Discussed clinic routines, schedule of care and testing, genetic screening options, involvement of students and residents under the direct supervision of APPs and doctors and presence of female providers. Pt verbalized understanding.   Federico Flake, MD 11/21/2020 9:46 AM

## 2020-11-22 LAB — CBC/D/PLT+RPR+RH+ABO+RUBIGG...
Antibody Screen: NEGATIVE
Basophils Absolute: 0 10*3/uL (ref 0.0–0.2)
Basos: 1 %
EOS (ABSOLUTE): 0.1 10*3/uL (ref 0.0–0.4)
Eos: 1 %
HCV Ab: <0.1 s/co ratio (ref 0.0–0.9)
HIV Screen 4th Generation wRfx: NONREACTIVE
Hematocrit: 39.5 % (ref 34.0–46.6)
Hemoglobin: 13.5 g/dL (ref 11.1–15.9)
Hepatitis B Surface Ag: NEGATIVE
Immature Grans (Abs): 0 10*3/uL (ref 0.0–0.1)
Immature Granulocytes: 1 %
Lymphocytes Absolute: 1.4 10*3/uL (ref 0.7–3.1)
Lymphs: 17 %
MCH: 28 pg (ref 26.6–33.0)
MCHC: 34.2 g/dL (ref 31.5–35.7)
MCV: 82 fL (ref 79–97)
Monocytes Absolute: 0.4 10*3/uL (ref 0.1–0.9)
Monocytes: 4 %
Neutrophils Absolute: 6.5 10*3/uL (ref 1.4–7.0)
Neutrophils: 76 %
Platelets: 221 10*3/uL (ref 150–450)
RBC: 4.83 x10E6/uL (ref 3.77–5.28)
RDW: 12.6 % (ref 11.7–15.4)
RPR Ser Ql: NONREACTIVE
Rh Factor: POSITIVE
Rubella Antibodies, IGG: 1.77 index (ref >0.99)
WBC: 8.5 10*3/uL (ref 3.4–10.8)

## 2020-11-22 LAB — HCV INTERPRETATION

## 2020-11-23 LAB — URINE CULTURE, OB REFLEX

## 2020-11-23 LAB — CULTURE, OB URINE

## 2020-11-29 LAB — CYTOLOGY - PAP
Chlamydia: NEGATIVE
Comment: NEGATIVE
Comment: NEGATIVE
Comment: NORMAL
Diagnosis: NEGATIVE
Neisseria Gonorrhea: NEGATIVE
Trichomonas: NEGATIVE

## 2020-11-30 ENCOUNTER — Telehealth: Payer: Self-pay | Admitting: *Deleted

## 2020-11-30 ENCOUNTER — Encounter: Payer: Self-pay | Admitting: *Deleted

## 2020-11-30 NOTE — Telephone Encounter (Signed)
Pt informed of Horizon results and Panorama risk results, pt did not want to know gender. Her sister Adela Lank will pick up results. Advised that results will be scanned into mychart as well.

## 2020-11-30 NOTE — Telephone Encounter (Signed)
Left message for pt to call back regarding her genetic results

## 2020-12-19 ENCOUNTER — Ambulatory Visit (INDEPENDENT_AMBULATORY_CARE_PROVIDER_SITE_OTHER): Payer: Medicaid Other | Admitting: Family Medicine

## 2020-12-19 ENCOUNTER — Other Ambulatory Visit: Payer: Self-pay

## 2020-12-19 VITALS — BP 114/70 | HR 64 | Wt 177.0 lb

## 2020-12-19 DIAGNOSIS — O0990 Supervision of high risk pregnancy, unspecified, unspecified trimester: Secondary | ICD-10-CM

## 2020-12-19 DIAGNOSIS — O99212 Obesity complicating pregnancy, second trimester: Secondary | ICD-10-CM | POA: Diagnosis not present

## 2020-12-19 NOTE — Progress Notes (Signed)
   PRENATAL VISIT NOTE  Subjective:  Jamie Escobar is a 20 y.o. G2P1001 at [redacted]w[redacted]d being seen today for ongoing prenatal care.  She is currently monitored for the following issues for this low-risk pregnancy and has Gastroesophageal reflux disease; Menorrhagia with irregular cycle; Skin lesion; Chronic rhinitis; Supervision of high risk pregnancy, antepartum; and Obesity affecting pregnancy on their problem list.  Patient reports no complaints.  Contractions: Not present. Vag. Bleeding: None.   . Denies leaking of fluid.   The following portions of the patient's history were reviewed and updated as appropriate: allergies, current medications, past family history, past medical history, past social history, past surgical history and problem list.   Objective:   Vitals:   12/19/20 0934  BP: 114/70  Pulse: 64  Weight: 177 lb (80.3 kg)    Fetal Status: Fetal Heart Rate (bpm): 154         General:  Alert, oriented and cooperative. Patient is in no acute distress.  Skin: Skin is warm and dry. No rash noted.   Cardiovascular: Normal heart rate noted  Respiratory: Normal respiratory effort, no problems with respiration noted  Abdomen: Soft, gravid, appropriate for gestational age.  Pain/Pressure: Present     Pelvic: Cervical exam deferred        Extremities: Normal range of motion.  Edema: None  Mental Status: Normal mood and affect. Normal behavior. Normal judgment and thought content.   Assessment and Plan:  Pregnancy: G2P1001 at [redacted]w[redacted]d 1. Supervision of high risk pregnancy, antepartum No concerns today Asked about getting fetal doppler. Discussed coming in for teaching if desired.  Having mild nausea, improving overall.   2. Obesity affecting pregnancy in second trimester TWG=19 lb (8.618 kg)   Preterm labor symptoms and general obstetric precautions including but not limited to vaginal bleeding, contractions, leaking of fluid and fetal movement were reviewed in detail with the  patient. Please refer to After Visit Summary for other counseling recommendations.   Return in about 4 weeks (around 01/16/2021) for Routine prenatal care, MD or APP.  Future Appointments  Date Time Provider Department Center  01/21/2021  8:15 AM [facility] NURSE [facility]  01/21/2021  8:30 AM [facility] US3 [facility]  01/22/2021  1:10 PM Anyanwu, Jethro Bastos, MD [facility]    Federico Flake, MD

## 2021-01-21 ENCOUNTER — Other Ambulatory Visit: Payer: Self-pay

## 2021-01-21 ENCOUNTER — Ambulatory Visit: Payer: Medicaid Other | Admitting: *Deleted

## 2021-01-21 ENCOUNTER — Other Ambulatory Visit: Payer: Self-pay | Admitting: *Deleted

## 2021-01-21 ENCOUNTER — Ambulatory Visit: Payer: Medicaid Other | Attending: Family Medicine

## 2021-01-21 ENCOUNTER — Encounter: Payer: Self-pay | Admitting: *Deleted

## 2021-01-21 VITALS — BP 113/62 | HR 80

## 2021-01-21 DIAGNOSIS — O0990 Supervision of high risk pregnancy, unspecified, unspecified trimester: Secondary | ICD-10-CM | POA: Diagnosis present

## 2021-01-21 DIAGNOSIS — O99212 Obesity complicating pregnancy, second trimester: Secondary | ICD-10-CM

## 2021-01-21 DIAGNOSIS — Z362 Encounter for other antenatal screening follow-up: Secondary | ICD-10-CM

## 2021-01-21 DIAGNOSIS — R638 Other symptoms and signs concerning food and fluid intake: Secondary | ICD-10-CM

## 2021-01-22 ENCOUNTER — Encounter: Payer: Self-pay | Admitting: Obstetrics & Gynecology

## 2021-01-22 ENCOUNTER — Ambulatory Visit (INDEPENDENT_AMBULATORY_CARE_PROVIDER_SITE_OTHER): Payer: Medicaid Other | Admitting: Obstetrics & Gynecology

## 2021-01-22 VITALS — BP 130/81 | HR 76 | Wt 185.0 lb

## 2021-01-22 DIAGNOSIS — O99612 Diseases of the digestive system complicating pregnancy, second trimester: Secondary | ICD-10-CM

## 2021-01-22 DIAGNOSIS — K219 Gastro-esophageal reflux disease without esophagitis: Secondary | ICD-10-CM

## 2021-01-22 DIAGNOSIS — O0990 Supervision of high risk pregnancy, unspecified, unspecified trimester: Secondary | ICD-10-CM

## 2021-01-22 DIAGNOSIS — Z3A19 19 weeks gestation of pregnancy: Secondary | ICD-10-CM

## 2021-01-22 MED ORDER — PANTOPRAZOLE SODIUM 40 MG PO TBEC: 40.0000 mg | DELAYED_RELEASE_TABLET | Freq: Every day | ORAL | 2 refills | Status: DC

## 2021-01-22 NOTE — Patient Instructions (Signed)

## 2021-01-22 NOTE — Progress Notes (Signed)
PRENATAL VISIT NOTE  Subjective:  Jamie Escobar is a 20 y.o. G2P1001 at [redacted]w[redacted]d being seen today for ongoing prenatal care.  She is currently monitored for the following issues for this high-risk pregnancy and has Gastroesophageal reflux disease; Supervision of high risk pregnancy, antepartum; and Obesity affecting pregnancy on their problem list.  Patient reports heartburn, was on Protonix before pregnancy and wants this re-ordered.  Contractions: Not present. Vag. Bleeding: None.  Movement: Present. Denies leaking of fluid.   The following portions of the patient's history were reviewed and updated as appropriate: allergies, current medications, past family history, past medical history, past social history, past surgical history and problem list.   Objective:   Vitals:   01/22/21 1322  BP: 130/81  Pulse: 76  Weight: 185 lb (83.9 kg)    Fetal Status: Fetal Heart Rate (bpm): 152   Movement: Present     General:  Alert, oriented and cooperative. Patient is in no acute distress.  Skin: Skin is warm and dry. No rash noted.   Cardiovascular: Normal heart rate noted  Respiratory: Normal respiratory effort, no problems with respiration noted  Abdomen: Soft, gravid, appropriate for gestational age.  Pain/Pressure: Absent     Pelvic: Cervical exam deferred        Extremities: Normal range of motion.     Mental Status: Normal mood and affect. Normal behavior. Normal judgment and thought content.   Imaging: [place] MFM OB DETAIL +14 WK  Result Date: 01/21/2021 ----------------------------------------------------------------------  OBSTETRICS REPORT                       (Signed Final 01/21/2021 09:31 am) ---------------------------------------------------------------------- Patient Info  ID #:       [id number]                          D.O.B.:  2001/01/07 (20 yrs)  Name:       Jamie Escobar              Visit Date: 01/21/2021 08:03 am  ---------------------------------------------------------------------- Performed By  Attending:        Ma Rings MD         Ref. Address:     [address]                                                             [address]  Performed By:     Fayne Norrie BS,      [facility]                                [facility] at                                                             [facility]  Referred By:      Federico Flake MD ---------------------------------------------------------------------- Orders  #  Description                           Code        Ordered By  1  [place] MFM OB DETAIL +14 WK               76811.01    [facility] [place] ----------------------------------------------------------------------  #  Order #                     Accession #                Episode #  1  [id number]                   [id number]                 [id number] ---------------------------------------------------------------------- Indications  Obesity complicating pregnancy, second         O99.212  trimester  Encounter for antenatal screening for          Z36.3  malformations  LR NIPS-female  Horizon - negative  [redacted] weeks gestation of pregnancy                Z3A.19 ---------------------------------------------------------------------- Fetal Evaluation  Num Of Fetuses:         1  Fetal Heart Rate(bpm):  150  Cardiac Activity:       Observed  Presentation:           Cephalic  Placenta:               Anterior  P. Cord Insertion:      Visualized, central  Amniotic Fluid  AFI FV:      Within normal limits                              Largest Pocket(cm)                              5.8 ---------------------------------------------------------------------- Biometry  BPD:      45.3  mm     G. Age:  19w 5d         74  %    CI:        71.78   %    70 - 86                                                           FL/HC:      18.2   %    16.1 - 18.3  HC:      170.2  mm     G. Age:  19w 4d         66  %    HC/AC:      1.16        1.09 - 1.39  AC:      146.8  mm     G. Age:  28w 0d  73  %    FL/BPD:     68.2   %  FL:       30.9  mm     G. Age:  19w 4d         59  %    FL/AC:      21.0   %    20 - 24  HUM:      28.9  mm     G. Age:  19w 3d         57  %  CER:      19.7  mm     G. Age:  19w 1d         40  %  NFT:       4.2  mm  LV:        5.5  mm  CM:        2.8  mm  Est. FW:     312  gm    0 lb 11 oz      81  % ---------------------------------------------------------------------- OB History  Gravidity:    2         Term:   1  Living:       1 ---------------------------------------------------------------------- Gestational Age  LMP:           19w 1d        Date:  09/09/20                 EDD:   06/16/21  U/S Today:     19w 5d                                        EDD:   06/12/21  Best:          19w 1d     Det. By:  LMP  (09/09/20)          EDD:   06/16/21 ---------------------------------------------------------------------- Anatomy  Cranium:               Appears normal         LVOT:                   Appears normal  Cavum:                 Appears normal         Aortic Arch:            Not well visualized  Ventricles:            Appears normal         Ductal Arch:            Not well visualized  Choroid Plexus:        Appears normal         Diaphragm:              Appears normal  Cerebellum:            Appears normal         Stomach:                Appears normal, left  sided  Posterior Fossa:       Appears normal         Abdomen:                Appears normal  Nuchal Fold:           Appears normal         Abdominal Wall:         Appears nml (cord                                                                        insert, abd wall)  Face:                  Not well visualized    Cord Vessels:           Appears normal (3                                                                         vessel cord)  Lips:                  Appears normal         Kidneys:                Appear normal  Palate:                Not well visualized    Bladder:                Appears normal  Thoracic:              Appears normal         Spine:                  Appears normal  Heart:                 Appears normal         Upper Extremities:      Appears normal                         (4CH, axis, and                         situs)  RVOT:                  Not well visualized    Lower Extremities:      Appears normal  Other:  Fetus appears to be female. Heels/feet and open hands/5th digits          visualized. Technically difficult due to fetal position. ---------------------------------------------------------------------- Cervix Uterus Adnexa  Cervix  Length:           3.19  cm.  Normal appearance by transabdominal scan.  Uterus  No abnormality visualized.  Right Ovary  Within normal limits.  Left Ovary  Within normal limits.  Cul De Sac  No  free fluid seen.  Adnexa  No abnormality visualized. ---------------------------------------------------------------------- Comments  This patient was seen for a detailed fetal anatomy scan due  to maternal obesity.  She denies any significant past medical history and denies  any problems in her current pregnancy.  She had a cell free DNA test earlier in her pregnancy which  indicated a low risk for trisomy 57, 9, and 13. A female fetus  is predicted.  She was informed that the fetal growth and amniotic fluid  level were appropriate for her gestational age.  There were no obvious fetal anomalies noted on today's  ultrasound exam.  However, the views of the fetal anatomy  were limited today due to the fetal position.  The patient was informed that anomalies may be missed due  to technical limitations. If the fetus is in a suboptimal position  or maternal habitus is increased, visualization of the fetus in  the maternal uterus may be  impaired.  A follow-up exam was scheduled in 4 weeks to complete the  views of the fetal anatomy. ----------------------------------------------------------------------                   Ma Rings, MD Electronically Signed Final Report   01/21/2021 09:31 am ----------------------------------------------------------------------   Assessment and Plan:  Pregnancy: G2P1001 at [redacted]w[redacted]d 1. Gastroesophageal reflux during pregnancy in second trimester, antepartum Protonix refilled. - pantoprazole (PROTONIX) 40 MG tablet; Take 1 tablet (40 mg total) by mouth daily.  Dispense: 30 tablet; Refill: 2  2. [redacted] weeks gestation of pregnancy 3. Supervision of high risk pregnancy, antepartum Low risk NIPS. AFP today.  Rescan of anatomy scheduled. - AFP, Serum, Open Spina Bifida Preterm labor symptoms and general obstetric precautions including but not limited to vaginal bleeding, contractions, leaking of fluid and fetal movement were reviewed in detail with the patient. Please refer to After Visit Summary for other counseling recommendations.   Return in about 4 weeks (around 02/19/2021) for OFFICE OB VISIT (MD or APP).  Future Appointments  Date Time Provider Department Center  02/21/2021  8:30 AM [facility] NURSE [facility]  02/21/2021  8:45 AM [facility] US4 [facility]  02/21/2021  3:10 PM Kiaira Pointer, Jethro Bastos, MD [facility]    Jaynie Collins, MD

## 2021-01-24 LAB — AFP, SERUM, OPEN SPINA BIFIDA
AFP MoM: 0.8
AFP Value: 38.1 ng/mL
Gest. Age on Collection Date: 19.3 weeks
Maternal Age At EDD: 21.9 yr
OSBR Risk 1 IN: 10000
Test Results:: NEGATIVE
Weight: 185 [lb_av]

## 2021-02-18 ENCOUNTER — Encounter: Payer: Self-pay | Admitting: Obstetrics and Gynecology

## 2021-02-18 ENCOUNTER — Observation Stay
Admission: EM | Admit: 2021-02-18 | Discharge: 2021-02-18 | Disposition: A | Discharge status: Home / Self Care | Payer: Medicaid Other | Attending: Obstetrics and Gynecology | Admitting: Obstetrics and Gynecology

## 2021-02-18 ENCOUNTER — Other Ambulatory Visit: Payer: Self-pay

## 2021-02-18 DIAGNOSIS — Z3A00 Weeks of gestation of pregnancy not specified: Secondary | ICD-10-CM | POA: Diagnosis not present

## 2021-02-18 DIAGNOSIS — Z3A23 23 weeks gestation of pregnancy: Secondary | ICD-10-CM | POA: Diagnosis not present

## 2021-02-18 DIAGNOSIS — R58 Hemorrhage, not elsewhere classified: Secondary | ICD-10-CM | POA: Diagnosis present

## 2021-02-18 DIAGNOSIS — O4692 Antepartum hemorrhage, unspecified, second trimester: Secondary | ICD-10-CM | POA: Diagnosis not present

## 2021-02-18 DIAGNOSIS — O4690 Antepartum hemorrhage, unspecified, unspecified trimester: Principal | ICD-10-CM | POA: Insufficient documentation

## 2021-02-18 DIAGNOSIS — O99212 Obesity complicating pregnancy, second trimester: Secondary | ICD-10-CM

## 2021-02-18 LAB — URINALYSIS, COMPLETE (UACMP) WITH MICROSCOPIC
Bacteria, UA: NONE SEEN
Bilirubin Urine: NEGATIVE
Glucose, UA: NEGATIVE mg/dL
Ketones, ur: NEGATIVE mg/dL
Nitrite: NEGATIVE
Protein, ur: 30 mg/dL — AB
RBC / HPF: >50 RBC/hpf — ABNORMAL HIGH (ref 0–5)
Specific Gravity, Urine: 1.019 (ref 1.005–1.030)
pH: 6 (ref 5.0–8.0)

## 2021-02-18 LAB — CHLAMYDIA/NGC RT PCR (ARMC ONLY)
Chlamydia Tr: NOT DETECTED
N gonorrhoeae: NOT DETECTED

## 2021-02-18 NOTE — Discharge Summary (Signed)
RN reviewed discharge instructions with patient. Gave patient opportunity for questions. All questions answered at this time. Pt verbalized understanding. Pt discharged home with significant other.  °

## 2021-02-18 NOTE — Final Progress Note (Signed)
L&D OB Triage Note  HPI:  Jamie Escobar is an unassigned 20 y.o. G14P1001 female at [redacted]w[redacted]d, EDD Estimated Date of Delivery: 06/16/21 who presented to triage for complaints of vaginal bleeding since 11:30 a.m. this morning.  She receives prenatal care at [facility] [employer] for [employer] at [facility]. Of note, patient had intercourse last night. Bleeding was dark red in color. Initially went through an entire pad, however now is down to a pantyliner.  Reports good fetal movement, and denies leaking of fluids or contractions. Also denies urinary symptoms or vaginal discharge.  She was evaluated by the nurses with no significant findings for preterm labor or fetal distress. Vital signs stable. An NST was performed and has been reviewed by MD.   Physical Exam:  Blood pressure 121/69, pulse 81, temperature 99 F (37.2 C), temperature source Oral, resp. rate 18, height 5\' 1"  (1.549 m), weight 83.9 kg, last menstrual period 09/09/2020, SpO2 100 %. Cervical exam deferred.   NST INTERPRETATION: Indications: rule out uterine contractions  Mode: External Baseline Rate (A): 145 bpm Variability: Moderate Accelerations: None Decelerations: Variable     Contraction Frequency (min): UI  Impression: reactive   Labs:  Results for orders placed or performed during the hospital encounter of 02/18/21  Chlamydia/NGC rt PCR ([facility] only)   Specimen: Urine, Clean Catch  Result Value Ref Range   Specimen source GC/Chlam ENDOCERVICAL    Chlamydia Tr NOT DETECTED NOT DETECTED   N gonorrhoeae NOT DETECTED NOT DETECTED  Urinalysis, Complete w Microscopic Urine, Clean Catch  Result Value Ref Range   Color, Urine YELLOW (A) YELLOW   APPearance CLOUDY (A) CLEAR   Specific Gravity, Urine 1.019 1.005 - 1.030   pH 6.0 5.0 - 8.0   Glucose, UA NEGATIVE NEGATIVE mg/dL   Hgb urine dipstick LARGE (A) NEGATIVE   Bilirubin Urine NEGATIVE NEGATIVE   Ketones, ur NEGATIVE NEGATIVE mg/dL   Protein, ur 30 (A) NEGATIVE  mg/dL   Nitrite NEGATIVE NEGATIVE   Leukocytes,Ua SMALL (A) NEGATIVE   RBC / HPF >50 (H) 0 - 5 RBC/hpf   WBC, UA 21-50 0 - 5 WBC/hpf   Bacteria, UA NONE SEEN NONE SEEN   Squamous Epithelial / LPF 0-5 0 - 5   Mucus PRESENT     Plan: NST performed was reviewed and was found to be reactive. She was discharged home with bleeding/preterm labor precautions.  Advised on pelvic rest until seen by her OB/GYN. Continue routine prenatal care. Follow up with OB/GYN as previously scheduled.     04/20/21, MD [facility]

## 2021-02-18 NOTE — OB Triage Note (Signed)
Pt presents c/o vaginal bleeding that started around 11:30. Pt reports having intercourse last night. Pt states the bleeding was dark red in color. Pt reports going through a pad in an hour. Pt denies any LOF. Reports positive fetal movement. Pt oral temp of 99. Pt denies any burning or hurting when she urinates. All other VSS. Will conitnue to monitor.

## 2021-02-21 ENCOUNTER — Ambulatory Visit: Payer: Medicaid Other | Admitting: *Deleted

## 2021-02-21 ENCOUNTER — Ambulatory Visit: Payer: Medicaid Other | Attending: Obstetrics

## 2021-02-21 ENCOUNTER — Ambulatory Visit (INDEPENDENT_AMBULATORY_CARE_PROVIDER_SITE_OTHER): Payer: Medicaid Other | Admitting: Obstetrics & Gynecology

## 2021-02-21 ENCOUNTER — Encounter: Payer: Self-pay | Admitting: *Deleted

## 2021-02-21 ENCOUNTER — Other Ambulatory Visit: Payer: Self-pay

## 2021-02-21 VITALS — BP 119/79 | HR 91 | Wt 188.0 lb

## 2021-02-21 VITALS — BP 121/68 | HR 80

## 2021-02-21 DIAGNOSIS — O99212 Obesity complicating pregnancy, second trimester: Secondary | ICD-10-CM | POA: Insufficient documentation

## 2021-02-21 DIAGNOSIS — Z3A23 23 weeks gestation of pregnancy: Secondary | ICD-10-CM

## 2021-02-21 DIAGNOSIS — E669 Obesity, unspecified: Secondary | ICD-10-CM | POA: Diagnosis not present

## 2021-02-21 DIAGNOSIS — Z23 Encounter for immunization: Secondary | ICD-10-CM | POA: Diagnosis not present

## 2021-02-21 DIAGNOSIS — R638 Other symptoms and signs concerning food and fluid intake: Secondary | ICD-10-CM | POA: Insufficient documentation

## 2021-02-21 DIAGNOSIS — O0990 Supervision of high risk pregnancy, unspecified, unspecified trimester: Secondary | ICD-10-CM

## 2021-02-21 DIAGNOSIS — Z362 Encounter for other antenatal screening follow-up: Secondary | ICD-10-CM | POA: Diagnosis not present

## 2021-02-21 NOTE — Patient Instructions (Signed)
Return to office for any scheduled appointments. Call the office or go to the MAU at [facility] if:  You begin to have strong, frequent contractions  Your water breaks.  Sometimes it is a big gush of fluid, sometimes it is just a trickle that keeps getting your panties wet or running down your legs  You have vaginal bleeding.  It is normal to have a small amount of spotting if your cervix was checked.   You do not feel your baby moving like normal.  If you do not, get something to eat and drink and lay down and focus on feeling your baby move.   If your baby is still not moving like normal, you should call the office or go to MAU.  Any other obstetric concerns.   

## 2021-02-21 NOTE — Progress Notes (Signed)
PRENATAL VISIT NOTE  Subjective:  Jamie Escobar is a 20 y.o. G2P1001 at [redacted]w[redacted]d being seen today for ongoing prenatal care.  She is currently monitored for the following issues for this high-risk pregnancy and has Gastroesophageal reflux disease; Supervision of high risk pregnancy, antepartum; Obesity affecting pregnancy; Vaginal bleeding in pregnancy, second trimester; and Bleeding on their problem list.  Patient reports no complaints. Had episode of spotting on Monday and went to ED, had negative evaluation.  Contractions: Not present. Vag. Bleeding: None.  Movement: Present. Denies leaking of fluid.   The following portions of the patient's history were reviewed and updated as appropriate: allergies, current medications, past family history, past medical history, past social history, past surgical history and problem list.   Objective:   Vitals:   02/21/21 1504  BP: 119/79  Pulse: 91  Weight: 188 lb (85.3 kg)    Fetal Status: Fetal Heart Rate (bpm): 147   Movement: Present     General:  Alert, oriented and cooperative. Patient is in no acute distress.  Skin: Skin is warm and dry. No rash noted.   Cardiovascular: Normal heart rate noted  Respiratory: Normal respiratory effort, no problems with respiration noted  Abdomen: Soft, gravid, appropriate for gestational age.  Pain/Pressure: Present     Pelvic: Cervical exam deferred        Extremities: Normal range of motion.     Mental Status: Normal mood and affect. Normal behavior. Normal judgment and thought content.   [place] MFM OB FOLLOW UP  Result Date: 02/21/2021 ----------------------------------------------------------------------  OBSTETRICS REPORT                       (Signed Final 02/21/2021 09:54 am) ---------------------------------------------------------------------- Patient Info  ID #:       [id number]                          D.O.B.:  10-Feb-2001 (20 yrs)  Name:       Jamie Escobar              Visit Date: 02/21/2021  08:55 am ---------------------------------------------------------------------- Performed By  Attending:        Ma Rings MD         Ref. Address:     [address]                                                             [address]  Performed By:     Redgie Grayer      [facility]  Referred By:      Federico Flake MD ---------------------------------------------------------------------- Orders  #  Description                           Code        Ordered By  1  [place] MFM OB FOLLOW UP                   [phone]    YU FANG ----------------------------------------------------------------------  #  Order #                     Accession #                Episode #  1  [id number]                   [id number]                 [id number] ---------------------------------------------------------------------- Indications  Obesity complicating pregnancy, second         O99.212  trimester  [redacted] weeks gestation of pregnancy                Z3A.23  Encounter for antenatal screening for          Z36.3  malformations  LR NIPS-female  Horizon - negative ---------------------------------------------------------------------- Fetal Evaluation  Num Of Fetuses:         1  Cardiac Activity:       Observed  Placenta:               Anterior  P. Cord Insertion:      Previously Visualized  Amniotic Fluid  AFI FV:      Within normal limits                              Largest Pocket(cm)                              7.23 ---------------------------------------------------------------------- Biometry  BPD:      60.1  mm     G. Age:  24w 4d         77  %    CI:        77.19   %    70 - 86                                                          FL/HC:      19.2   %    18.7 - 20.9  HC:       216.6  mm     G. Age:  23w 5d  38  %    HC/AC:      1.06        1.05 - 1.21  AC:      203.6  mm     G. Age:  25w 0d         83  %    FL/BPD:     69.2   %    71 - 87  FL:       41.6  mm     G. Age:  23w 4d         36  %    FL/AC:      20.4   %    20 - 24  LV:        5.3  mm  Est. FW:     681  gm      1 lb 8 oz     76  % ---------------------------------------------------------------------- OB History  Gravidity:    2         Term:   1  Living:       1 ---------------------------------------------------------------------- Gestational Age  LMP:           23w 4d        Date:  09/09/20                 EDD:   06/16/21  U/S Today:     24w 2d                                        EDD:   06/11/21  Best:          23w 4d     Det. By:  LMP  (09/09/20)          EDD:   06/16/21 ---------------------------------------------------------------------- Anatomy  Cranium:               Appears normal         LVOT:                   Appears normal  Cavum:                 Appears normal         Aortic Arch:            Appears normal  Ventricles:            Appears normal         Ductal Arch:            Appears normal  Choroid Plexus:        Appears normal         Diaphragm:              Appears normal  Cerebellum:            Appears normal         Stomach:                Appears normal, left  sided  Posterior Fossa:       Appears normal         Abdomen:                Appears normal  Nuchal Fold:           Not applicable (>20    Abdominal Wall:         Previously seen                         wks GA)  Face:                  Appears normal         Cord Vessels:           Previously seen                         (orbits and profile)  Lips:                  Previously seen        Kidneys:                Appear normal  Palate:                Appears normal         Bladder:                Appears normal  Thoracic:              Appears normal         Spine:                   Previously seen  Heart:                 Appears normal         Upper Extremities:      Previously seen                         (4CH, axis, and                         situs)  RVOT:                  Appears normal         Lower Extremities:      Previously seen  Other:  Fetus appears to be female. Nasal bone, lenses, heels/feet, open          hands/5th digits, VC, 3VV visualized. Technically difficult due to fetal          position. ---------------------------------------------------------------------- Cervix Uterus Adnexa  Cervix  Length:           4.94  cm.  Normal appearance by transabdominal scan.  Uterus  No abnormality visualized.  Right Ovary  Within normal limits.  Left Ovary  Within normal limits.  Adnexa  No adnexal mass visualized. ---------------------------------------------------------------------- Comments  This patient was seen for a follow up exam as the views of  the fetal anatomy were unable to be fully visualized during  her last exam.  She denies any problems since her last exam.  She was informed that the fetal growth and amniotic fluid  level appears appropriate for her gestational age.  The views of the fetal anatomy were visualized today.  There  were no obvious  anomalies noted.  The limitations of ultrasound in the detection of all anomalies  was discussed.  Follow-up as indicated. ----------------------------------------------------------------------                   Ma Rings, MD Electronically Signed Final Report   02/21/2021 09:54 am ----------------------------------------------------------------------   Assessment and Plan:  Pregnancy: G2P1001 at [redacted]w[redacted]d 1. Flu vaccine need - Flu Vaccine QUAD 36+ mos IM (Fluarix, Fluzone & Afluria Quad PF  2. [redacted] weeks gestation of pregnancy 3. Supervision of high risk pregnancy, antepartum Preterm labor symptoms and general obstetric precautions including but not limited to vaginal bleeding, contractions, leaking of fluid and fetal  movement were reviewed in detail with the patient. Please refer to After Visit Summary for other counseling recommendations.   Return in about 4 weeks (around 03/21/2021) for 2 hr GTT, 3rd trimester labs, TDap, OFFICE OB VISIT (MD or APP), sign MCD BTL papers.  Future Appointments  Date Time Provider Department Center  03/21/2021  8:30 AM [facility] LAB [facility]  03/21/2021 10:10 AM Calvert Cantor, CNM [facility]    Jaynie Collins, MD

## 2021-03-21 ENCOUNTER — Other Ambulatory Visit: Payer: Self-pay

## 2021-03-21 ENCOUNTER — Encounter: Payer: Self-pay | Admitting: Advanced Practice Midwife

## 2021-03-21 ENCOUNTER — Ambulatory Visit (INDEPENDENT_AMBULATORY_CARE_PROVIDER_SITE_OTHER): Payer: Medicaid Other | Admitting: Advanced Practice Midwife

## 2021-03-21 ENCOUNTER — Other Ambulatory Visit: Payer: Medicaid Other

## 2021-03-21 VITALS — BP 110/75 | HR 99 | Wt 188.0 lb

## 2021-03-21 DIAGNOSIS — O0990 Supervision of high risk pregnancy, unspecified, unspecified trimester: Secondary | ICD-10-CM

## 2021-03-21 DIAGNOSIS — Z3009 Encounter for other general counseling and advice on contraception: Secondary | ICD-10-CM

## 2021-03-21 DIAGNOSIS — O99212 Obesity complicating pregnancy, second trimester: Secondary | ICD-10-CM

## 2021-03-21 DIAGNOSIS — Z23 Encounter for immunization: Secondary | ICD-10-CM

## 2021-03-21 LAB — CBC
Hematocrit: 36.4 % (ref 34.0–46.6)
Hemoglobin: 11.9 g/dL (ref 11.1–15.9)
MCH: 27.2 pg (ref 26.6–33.0)
MCHC: 32.7 g/dL (ref 31.5–35.7)
MCV: 83 fL (ref 79–97)
Platelets: 208 10*3/uL (ref 150–450)
RBC: 4.38 x10E6/uL (ref 3.77–5.28)
RDW: 12.3 % (ref 11.7–15.4)
WBC: 9.1 10*3/uL (ref 3.4–10.8)

## 2021-03-21 NOTE — Progress Notes (Signed)
   PRENATAL VISIT NOTE  Subjective:  Jamie Escobar is a 20 y.o. G2P1001 at [redacted]w[redacted]d being seen today for ongoing prenatal care.  She is currently monitored for the following issues for this high-risk pregnancy and has Gastroesophageal reflux disease; Supervision of high risk pregnancy, antepartum; Obesity affecting pregnancy; Vaginal bleeding in pregnancy, second trimester; and Bleeding on their problem list.  Patient reports no complaints.  Contractions: Irritability. Vag. Bleeding: None.  Movement: Present. Denies leaking of fluid.   The following portions of the patient's history were reviewed and updated as appropriate: allergies, current medications, past family history, past medical history, past social history, past surgical history and problem list. Problem list updated.  Objective:   Vitals:   03/21/21 0955  BP: 110/75  Pulse: 99  Weight: 188 lb (85.3 kg)    Fetal Status: Fetal Heart Rate (bpm): 147 Fundal Height: 29 cm Movement: Present     General:  Alert, oriented and cooperative. Patient is in no acute distress.  Skin: Skin is warm and dry. No rash noted.   Cardiovascular: Normal heart rate noted  Respiratory: Normal respiratory effort, no problems with respiration noted  Abdomen: Soft, gravid, appropriate for gestational age.  Pain/Pressure: Absent     Pelvic: Cervical exam deferred        Extremities: Normal range of motion.  Edema: None  Mental Status: Normal mood and affect. Normal behavior. Normal judgment and thought content.   Assessment and Plan:  Pregnancy: G2P1001 at [redacted]w[redacted]d  1. Supervision of high risk pregnancy, antepartum -  No episodes of bleeding since 02/18/21 visit (evaluated at [facility])  2. Obesity affecting pregnancy in second trimester - TWG 30 lb  3. Unwanted fertility - BTL papers signed today, continue discussion next visit for specifics of procedure  Preterm labor symptoms and general obstetric precautions including but not limited to  vaginal bleeding, contractions, leaking of fluid and fetal movement were reviewed in detail with the patient. Please refer to After Visit Summary for other counseling recommendations.  Return in about 2 weeks (around 04/04/2021).  Future Appointments  Date Time Provider Department Center  04/11/2021  8:15 AM [place] Bing, MD [facility]  04/25/2021  9:55 AM Calvert Cantor, CNM [facility]  05/09/2021  8:30 AM Calvert Cantor, CNM [facility]    Calvert Cantor, [place]

## 2021-03-21 NOTE — Progress Notes (Signed)
ROB [redacted]w[redacted]d  CC: None  Tdap offered pt desires.  Per notes sign BTL form today.

## 2021-03-22 LAB — RPR: RPR Ser Ql: NONREACTIVE

## 2021-03-22 LAB — HIV ANTIBODY (ROUTINE TESTING W REFLEX): HIV Screen 4th Generation wRfx: NONREACTIVE

## 2021-03-23 LAB — GLUCOSE TOLERANCE, 2 HOURS W/ 1HR
Glucose, 1 hour: 81 mg/dL (ref 70–179)
Glucose, 2 hour: 98 mg/dL (ref 70–152)
Glucose, Fasting: 78 mg/dL (ref 70–91)

## 2021-04-11 ENCOUNTER — Other Ambulatory Visit: Payer: Self-pay

## 2021-04-11 ENCOUNTER — Ambulatory Visit (INDEPENDENT_AMBULATORY_CARE_PROVIDER_SITE_OTHER): Payer: Medicaid Other | Admitting: Obstetrics and Gynecology

## 2021-04-11 VITALS — BP 117/78 | HR 93 | Wt 191.0 lb

## 2021-04-11 DIAGNOSIS — E669 Obesity, unspecified: Secondary | ICD-10-CM | POA: Insufficient documentation

## 2021-04-11 DIAGNOSIS — Z3A30 30 weeks gestation of pregnancy: Secondary | ICD-10-CM

## 2021-04-11 NOTE — Progress Notes (Signed)
   PRENATAL VISIT NOTE  Subjective:  Jamie Escobar is a 20 y.o. G2P1001 at [redacted]w[redacted]d being seen today for ongoing prenatal care.  She is currently monitored for the following issues for this low-risk pregnancy and has Gastroesophageal reflux disease; Supervision of high risk pregnancy, antepartum; Obesity affecting pregnancy; Vaginal bleeding in pregnancy, second trimester; Bleeding; and Obesity (BMI 30-39.9) on their problem list.  Patient reports no complaints.  Contractions: Not present. Vag. Bleeding: None.  Movement: Present. Denies leaking of fluid.   The following portions of the patient's history were reviewed and updated as appropriate: allergies, current medications, past family history, past medical history, past social history, past surgical history and problem list.   Objective:   Vitals:   04/11/21 0817  BP: 117/78  Pulse: 93  Weight: 191 lb (86.6 kg)    Fetal Status: Fetal Heart Rate (bpm): 145 Fundal Height: 30 cm Movement: Present     General:  Alert, oriented and cooperative. Patient is in no acute distress.  Skin: Skin is warm and dry. No rash noted.   Cardiovascular: Normal heart rate noted  Respiratory: Normal respiratory effort, no problems with respiration noted  Abdomen: Soft, gravid, appropriate for gestational age.  Pain/Pressure: Absent     Pelvic: Cervical exam deferred        Extremities: Normal range of motion.  Edema: None  Mental Status: Normal mood and affect. Normal behavior. Normal judgment and thought content.   Assessment and Plan:  Pregnancy: G2P1001 at [redacted]w[redacted]d 1. [redacted] weeks gestation of pregnancy Routine care. Pt to re-consider ppBTL. Papers not re-signed. ppLARC options d/w her  2. Obesity (BMI 30-39.9) TWG d/w her.   Preterm labor symptoms and general obstetric precautions including but not limited to vaginal bleeding, contractions, leaking of fluid and fetal movement were reviewed in detail with the patient. Please refer to After Visit  Summary for other counseling recommendations.   Return in about 2 weeks (around 04/25/2021) for 2-3wks, low risk ob, in person or virtual, md or app.  Future Appointments  Date Time Provider Department Center  04/25/2021  9:55 AM Calvert Cantor, CNM [facility]  05/09/2021  8:30 AM Calvert Cantor, CNM [facility]  05/22/2021  9:35 AM Reva Bores, MD [facility]  05/29/2021  8:35 AM Reva Bores, MD [facility]  06/05/2021  8:55 AM Reva Bores, MD [facility]    [place] Bing, MD

## 2021-04-25 ENCOUNTER — Encounter: Payer: Self-pay | Admitting: Advanced Practice Midwife

## 2021-04-25 ENCOUNTER — Ambulatory Visit (INDEPENDENT_AMBULATORY_CARE_PROVIDER_SITE_OTHER): Payer: Medicaid Other | Admitting: Advanced Practice Midwife

## 2021-04-25 ENCOUNTER — Other Ambulatory Visit: Payer: Self-pay

## 2021-04-25 VITALS — BP 125/84 | HR 77 | Wt 194.0 lb

## 2021-04-25 DIAGNOSIS — Z3A32 32 weeks gestation of pregnancy: Secondary | ICD-10-CM

## 2021-04-25 DIAGNOSIS — O0990 Supervision of high risk pregnancy, unspecified, unspecified trimester: Secondary | ICD-10-CM

## 2021-04-25 NOTE — Progress Notes (Signed)
° °  PRENATAL VISIT NOTE  Subjective:  Jamie Escobar is a 20 y.o. G2P1001 at [redacted]w[redacted]d being seen today for ongoing prenatal care.  She is currently monitored for the following issues for this high-risk pregnancy and has Gastroesophageal reflux disease; Supervision of high risk pregnancy, antepartum; Obesity affecting pregnancy; Vaginal bleeding in pregnancy, second trimester; Bleeding; and Obesity (BMI 30-39.9) on their problem list.  Patient reports no complaints.  Contractions: Not present. Vag. Bleeding: None.  Movement: Present. Denies leaking of fluid.   The following portions of the patient's history were reviewed and updated as appropriate: allergies, current medications, past family history, past medical history, past social history, past surgical history and problem list. Problem list updated.  Objective:   Vitals:   04/25/21 1008  BP: 125/84  Pulse: 77  Weight: 194 lb (88 kg)    Fetal Status: Fetal Heart Rate (bpm): 144 Fundal Height: 32 cm Movement: Present     General:  Alert, oriented and cooperative. Patient is in no acute distress.  Skin: Skin is warm and dry. No rash noted.   Cardiovascular: Normal heart rate noted  Respiratory: Normal respiratory effort, no problems with respiration noted  Abdomen: Soft, gravid, appropriate for gestational age.  Pain/Pressure: Absent     Pelvic: Cervical exam deferred        Extremities: Normal range of motion.  Edema: None  Mental Status: Normal mood and affect. Normal behavior. Normal judgment and thought content.   Assessment and Plan:  Pregnancy: G2P1001 at [redacted]w[redacted]d  1. Supervision of high risk pregnancy, antepartum - Continue daily kick counts  2. [redacted] weeks gestation of pregnancy   Preterm labor symptoms and general obstetric precautions including but not limited to vaginal bleeding, contractions, leaking of fluid and fetal movement were reviewed in detail with the patient. Please refer to After Visit Summary for other  counseling recommendations.  No follow-ups on file.  Future Appointments  Date Time Provider Department Center  05/09/2021  8:30 AM Calvert Cantor, [place] [facility]  05/22/2021  9:35 AM Reva Bores, MD [facility]  05/29/2021  8:35 AM Reva Bores, MD [facility]  06/05/2021  8:55 AM Reva Bores, MD [facility]  06/12/2021  8:35 AM Reva Bores, MD [facility]    Calvert Cantor, CNM

## 2021-04-25 NOTE — Progress Notes (Signed)
ROB [redacted]w[redacted]d  CC:None      

## 2021-05-09 ENCOUNTER — Encounter: Payer: Medicaid Other | Admitting: Advanced Practice Midwife

## 2021-05-09 ENCOUNTER — Other Ambulatory Visit: Payer: Self-pay

## 2021-05-09 ENCOUNTER — Telehealth (INDEPENDENT_AMBULATORY_CARE_PROVIDER_SITE_OTHER): Payer: Medicaid Other | Admitting: Advanced Practice Midwife

## 2021-05-09 DIAGNOSIS — O0990 Supervision of high risk pregnancy, unspecified, unspecified trimester: Secondary | ICD-10-CM

## 2021-05-09 DIAGNOSIS — O09893 Supervision of other high risk pregnancies, third trimester: Secondary | ICD-10-CM

## 2021-05-09 DIAGNOSIS — O99213 Obesity complicating pregnancy, third trimester: Secondary | ICD-10-CM

## 2021-05-09 DIAGNOSIS — Z3A34 34 weeks gestation of pregnancy: Secondary | ICD-10-CM

## 2021-05-09 DIAGNOSIS — O99212 Obesity complicating pregnancy, second trimester: Secondary | ICD-10-CM

## 2021-05-09 NOTE — Progress Notes (Signed)
° °  OBSTETRICS PRENATAL VIRTUAL VISIT ENCOUNTER NOTE  Provider location: Center for [facility] at [facility]   Patient location: Home  I connected with Jamie Escobar on 05/09/21 at  8:30 AM EST by MyChart Video Encounter and verified that I am speaking with the correct person using two identifiers. I discussed the limitations, risks, security and privacy concerns of performing an evaluation and management service virtually and the availability of in person appointments. I also discussed with the patient that there may be a patient responsible charge related to this service. The patient expressed understanding and agreed to proceed. Subjective:  Jamie Escobar is a 20 y.o. G2P1001 at 109w4d being seen today for ongoing prenatal care.  She is currently monitored for the following issues for this high-risk pregnancy and has Gastroesophageal reflux disease; Supervision of high risk pregnancy, antepartum; Obesity affecting pregnancy; Vaginal bleeding in pregnancy, second trimester; Bleeding; and Obesity (BMI 30-39.9) on their problem list.  Patient reports no complaints.  Contractions: Not present. Vag. Bleeding: None.  Movement: Present. Denies any leaking of fluid.   The following portions of the patient's history were reviewed and updated as appropriate: allergies, current medications, past family history, past medical history, past social history, past surgical history and problem list.   Objective:  There were no vitals filed for this visit.  Fetal Status:     Movement: Present     General:  Alert, oriented and cooperative. Patient is in no acute distress.  Respiratory: Normal respiratory effort, no problems with respiration noted  Mental Status: Normal mood and affect. Normal behavior. Normal judgment and thought content.  Rest of physical exam deferred due to type of encounter  Imaging: No results found.  Assessment and Plan:  Pregnancy: G2P1001 at [redacted]w[redacted]d 1.  Supervision of high risk pregnancy, antepartum - Converted to mychart as patient was snowed in while traveling in [place] - GBS and [facility] next visit. Preemptive discussion indications for test, impact on labor course if positive  2. Obesity affecting pregnancy in second trimester   3. [redacted] weeks gestation of pregnancy   Preterm labor symptoms and general obstetric precautions including but not limited to vaginal bleeding, contractions, leaking of fluid and fetal movement were reviewed in detail with the patient. I discussed the assessment and treatment plan with the patient. The patient was provided an opportunity to ask questions and all were answered. The patient agreed with the plan and demonstrated an understanding of the instructions. The patient was advised to call back or seek an in-person office evaluation/go to MAU at [facility] for any urgent or concerning symptoms. Please refer to After Visit Summary for other counseling recommendations.   I provided six minutes of face-to-face time during this encounter.   Future Appointments  Date Time Provider Department Center  05/22/2021  9:35 AM Reva Bores, MD [facility]  05/29/2021  8:35 AM Reva Bores, MD [facility]  06/05/2021  8:55 AM Reva Bores, MD [facility]  06/12/2021  8:35 AM Reva Bores, MD [facility]    Calvert Cantor, CNM Center for [employer], [facility]

## 2021-05-09 NOTE — Progress Notes (Signed)
Virtual ROB [redacted]w[redacted]d  Pt has no concerns today.

## 2021-05-10 ENCOUNTER — Encounter: Payer: Self-pay | Admitting: Advanced Practice Midwife

## 2021-05-10 ENCOUNTER — Inpatient Hospital Stay (HOSPITAL_COMMUNITY)
Admission: AD | Admit: 2021-05-10 | Discharge: 2021-05-10 | Disposition: A | Discharge status: Home / Self Care | Payer: Medicaid Other | Attending: Obstetrics and Gynecology | Admitting: Obstetrics and Gynecology

## 2021-05-10 ENCOUNTER — Other Ambulatory Visit: Payer: Self-pay

## 2021-05-10 ENCOUNTER — Encounter: Payer: Self-pay | Admitting: Family Medicine

## 2021-05-10 ENCOUNTER — Ambulatory Visit: Payer: Self-pay | Admitting: *Deleted

## 2021-05-10 ENCOUNTER — Encounter (HOSPITAL_COMMUNITY): Payer: Self-pay | Admitting: Obstetrics and Gynecology

## 2021-05-10 DIAGNOSIS — O26893 Other specified pregnancy related conditions, third trimester: Secondary | ICD-10-CM | POA: Diagnosis not present

## 2021-05-10 DIAGNOSIS — N898 Other specified noninflammatory disorders of vagina: Secondary | ICD-10-CM | POA: Diagnosis not present

## 2021-05-10 DIAGNOSIS — O99213 Obesity complicating pregnancy, third trimester: Secondary | ICD-10-CM | POA: Diagnosis not present

## 2021-05-10 DIAGNOSIS — R102 Pelvic and perineal pain: Secondary | ICD-10-CM | POA: Diagnosis not present

## 2021-05-10 DIAGNOSIS — Z3A34 34 weeks gestation of pregnancy: Secondary | ICD-10-CM | POA: Insufficient documentation

## 2021-05-10 DIAGNOSIS — Z0371 Encounter for suspected problem with amniotic cavity and membrane ruled out: Secondary | ICD-10-CM | POA: Diagnosis present

## 2021-05-10 LAB — POCT FERN TEST: POCT Fern Test: NEGATIVE

## 2021-05-10 NOTE — Telephone Encounter (Signed)
Reason for Disposition  Leakage of fluid from vagina  Answer Assessment - Initial Assessment Questions 1. ONSET: "When did the symptoms begin?"        Last night I having a lot of clear discharge but it's not constant.    My water is not broken.   I'll be 34 1/2 weeks on Sunday.    I tried to call my OB GYN but they are closed today.   This is my 2nd child and I didn't have this problem.   I had another gush of clear fluid today and last night.   No odor.   It's slimmer. 2. CONTRACTIONS: "Are you having any contractions?" If yes, ask: "Describe the contractions that you are having." (e.g., duration, frequency, regularity, severity)     I'm having a lot of lightening crotch.   Sharp pains that shoot through you.   3. EDD: "What date are you expecting to deliver?"     06/16/2021 4. PARITY: "Have you had a baby before?" If Yes, ask: "How long did the labor last?"     Yes 5. FETAL MOVEMENT: "Has the baby's movement decreased or changed significantly from normal?"     She is feeling movement more than usual today.    6. OTHER SYMPTOMS: "Do you have any other symptoms?" (e.g., abdominal pain, vaginal bleeding, fever, hand/facial swelling)     No swelling of hands, ankles or feet.  Protocols used: Pregnancy - Rupture of Membranes Suspected-A-AH

## 2021-05-10 NOTE — MAU Note (Signed)
...  Jamie Escobar is a 20 y.o. at [redacted]w[redacted]d here in MAU reporting: LOF since 1000 this morning. She states it has happened threee times and she has had a small trickle down her leg each time. She states she has been experiencing "lightening crotch" since then as well. +FM. No VB.   Pain score:  6/10 vagina  FHT: 142 doppler Lab orders placed from triage: Crist Fat

## 2021-05-10 NOTE — Telephone Encounter (Signed)
°  Chief Complaint: leaking clear fluid vaginally and she's almost [redacted] weeks pregnant Symptoms: had a gush of clear slimmy odorless fluid come out last night and again this morning.   Baby is moving more than usual.  Having sharp "lightening crotch" pains shooting through her. Frequency: Last night and this morning Pertinent Negatives: Patient denies having this problem with first baby.  No bleeding. Disposition: [x] ED /[] Urgent Care (no appt availability in office) / [] Appointment(In office/virtual)/ []  [place] [facility]/ [] Home Care/ [] Refused Recommended Disposition /[] [place] [facility]/ []  Follow-up with PCP Additional Notes: I have referred her to the [facility] at [facility].  She was familiar with it and knew where to go.

## 2021-05-10 NOTE — MAU Provider Note (Signed)
History     CSN: [id number]  Arrival date and time: 05/10/21 1558   None   Chief Complaint  Patient presents with   Rupture of Membranes   HPI Jamie Escobar is a 20 y.o. G2P1001 at 41w5dwho presents to MAU for leaking fluid. Patient reports that at 10 am she had a gush of clear/slimy discharge. This has happened 3x today. She has not had to wear a pad, but it is enough to soak through her underwear. She denies vaginal bleeding, itching, odor, or urinary s/s. No contractions, but reports some pelvic pain and "lightening shoots in vagina". Endorses active fetal movement.   OB History     Gravida  2   Para  1   Term  1   Preterm      AB      Living  1      SAB      IAB      Ectopic      Multiple  0   Live Births  1           Past Medical History:  Diagnosis Date   Chronic rhinitis 02/01/2020   Eczema    Hydronephrosis    Menorrhagia with irregular cycle 10/23/2017   Skin lesion 10/23/2017   Urticaria     Past Surgical History:  Procedure Laterality Date   FOOT SURGERY Right     Family History  Problem Relation Age of Onset   Cancer Father        brain    Diabetes Maternal Grandmother    Heart disease Maternal Grandmother    Allergic rhinitis Neg Hx    Angioedema Neg Hx    Asthma Neg Hx    Atopy Neg Hx    Eczema Neg Hx    Immunodeficiency Neg Hx    Urticaria Neg Hx     Social History   Tobacco Use   Smoking status: Former    Types: E-cigarettes   Smokeless tobacco: Never  Vaping Use   Vaping Use: Former   Substances: Nicotine  Substance Use Topics   Alcohol use: No   Drug use: Not Currently    Types: Marijuana    Allergies: No Known Allergies  Medications Prior to Admission  Medication Sig Dispense Refill Last Dose   aspirin EC 81 MG tablet Take 1 tablet (81 mg total) by mouth daily. Take after 12 weeks for prevention of preeclampsia later in pregnancy 300 tablet 2 05/09/2021   pantoprazole (PROTONIX) 40 MG tablet Take 1  tablet (40 mg total) by mouth daily. 30 tablet 2 05/10/2021   Prenatal Vit-Fe Fumarate-FA (PREPLUS) 27-1 MG TABS Take 1 tablet by mouth daily. 30 tablet 13 05/10/2021   Blood Pressure KIT 1 Device by Does not apply route once a week. 1 kit 0    triamcinolone ointment (KENALOG) 0.5 % Apply to affected areas twice daily (Patient not taking: Reported on 02/18/2021)       Review of Systems  Constitutional: Negative.   Respiratory: Negative.    Gastrointestinal: Negative.   Genitourinary:  Positive for pelvic pain and vaginal discharge. Negative for vaginal bleeding.  Musculoskeletal: Negative.   Neurological: Negative.    Physical Exam   Blood pressure 123/76, temperature 98.3 F (36.8 C), temperature source Oral, resp. rate 17, last menstrual period 09/09/2020, SpO2 100 %.  Physical Exam Vitals and nursing note reviewed.  Constitutional:      General: She is not in acute distress. Eyes:  Extraocular Movements: Extraocular movements intact.     Pupils: Pupils are equal, round, and reactive to light.  Cardiovascular:     Rate and Rhythm: Normal rate.  Pulmonary:     Effort: Pulmonary effort is normal.  Abdominal:     Tenderness: There is no abdominal tenderness.     Comments: Gravid   Genitourinary:    Comments: NEFG, vaginal walls pink with rugae, no blood, negative pooling of amniotic fluid, fern negative, small amount of white/clear mucoid discharge, cervix visually closed without lesions/masses SVE: closed/thick/ballotable Musculoskeletal:        General: Normal range of motion.     Cervical back: Normal range of motion.  Skin:    General: Skin is warm and dry.  Neurological:     General: No focal deficit present.     Mental Status: She is alert and oriented to person, place, and time.  Psychiatric:        Mood and Affect: Mood normal.        Behavior: Behavior normal.        Thought Content: Thought content normal.        Judgment: Judgment normal.   NST FHR:  140 bpm, moderate variability, +15x15, no decels Toco: occasional   MAU Course  Procedures NST  MDM SSE negative pooling. Fern negative. NST reactive and reassuring. Membranes intact.  Assessment and Plan  [redacted] weeks gestation of pregnancy Intact membranes Vaginal discharge NST reactive  - Discharge home in stable condition - Strict return precautions reviewed - Return to MAU as needed or for worsening symptoms - Keep OB appointment as scheduled on 05/22/2021   Renee Harder, [place] 05/10/2021, 5:28 PM

## 2021-05-12 NOTE — L&D Delivery Note (Signed)
Delivery Note Pt pushed x 12 minutes. At 10:45 AM a viable, healthy, and vigorous  female was delivered via Vaginal, Spontaneous (Presentation: LOA). Shoulders delivered easily. Infant placed skin-to-skin w/ mom. Delayed cord clamping x 1 minute. Cord clamped x 2 and cut by FOB. APGAR: 9, 9; weight  pending.   Placenta status:  spontaneous,  intact.  Cord:   with the following complications: none.  Cord pH: NA  Anesthesia: Epidural Episiotomy: None Lacerations: None Suture Repair:  NA Est. Blood Loss (mL):  125 cc's  Mom to postpartum.  Baby to Couplet care / Skin to Skin. Placenta to: LD Feeding: Breast Circ: NA Contraception: Depo  [place] 06/09/2021, 11:05 AM

## 2021-05-22 ENCOUNTER — Other Ambulatory Visit (HOSPITAL_COMMUNITY)
Admission: RE | Admit: 2021-05-22 | Discharge: 2021-05-22 | Disposition: A | Discharge status: Home / Self Care | Payer: Medicaid Other | Source: Ambulatory Visit | Attending: Family Medicine | Admitting: Family Medicine

## 2021-05-22 ENCOUNTER — Encounter: Payer: Self-pay | Admitting: Family Medicine

## 2021-05-22 ENCOUNTER — Other Ambulatory Visit: Payer: Self-pay

## 2021-05-22 ENCOUNTER — Ambulatory Visit (INDEPENDENT_AMBULATORY_CARE_PROVIDER_SITE_OTHER): Payer: Medicaid Other | Admitting: Family Medicine

## 2021-05-22 DIAGNOSIS — O0990 Supervision of high risk pregnancy, unspecified, unspecified trimester: Secondary | ICD-10-CM

## 2021-05-22 LAB — OB RESULTS CONSOLE GC/CHLAMYDIA: Gonorrhea: NEGATIVE

## 2021-05-22 NOTE — Progress Notes (Signed)
° °  PRENATAL VISIT NOTE  Subjective:  Jamie Escobar is a 21 y.o. G2P1001 at [redacted]w[redacted]d being seen today for ongoing prenatal care.  She is currently monitored for the following issues for this low-risk pregnancy and has Gastroesophageal reflux disease; Supervision of high risk pregnancy, antepartum; Obesity affecting pregnancy; Vaginal bleeding in pregnancy, second trimester; Bleeding; and Obesity (BMI 30-39.9) on their problem list.  Patient reports no complaints.  Contractions: Not present. Vag. Bleeding: None.  Movement: Present. Denies leaking of fluid.   The following portions of the patient's history were reviewed and updated as appropriate: allergies, current medications, past family history, past medical history, past social history, past surgical history and problem list.   Objective:   Vitals:   05/22/21 0939  BP: 126/85  Pulse: 71  Weight: 195 lb (88.5 kg)    Fetal Status: Fetal Heart Rate (bpm): 140 Fundal Height: 33 cm Movement: Present  Presentation: Vertex  General:  Alert, oriented and cooperative. Patient is in no acute distress.  Skin: Skin is warm and dry. No rash noted.   Cardiovascular: Normal heart rate noted  Respiratory: Normal respiratory effort, no problems with respiration noted  Abdomen: Soft, gravid, appropriate for gestational age.  Pain/Pressure: Absent     Pelvic: Cervical exam performed in the presence of a chaperone Dilation: Closed Effacement (%): 20 Station: -2  Extremities: Normal range of motion.  Edema: Trace  Mental Status: Normal mood and affect. Normal behavior. Normal judgment and thought content.   Assessment and Plan:  Pregnancy: G2P1001 at [redacted]w[redacted]d 1. Supervision of high risk pregnancy, antepartum Cultures today - Strep Gp B NAA - Cervicovaginal ancillary only( [place])  Preterm labor symptoms and general obstetric precautions including but not limited to vaginal bleeding, contractions, leaking of fluid and fetal movement were  reviewed in detail with the patient. Please refer to After Visit Summary for other counseling recommendations.   Return in 1 week (on 05/29/2021).  Future Appointments  Date Time Provider Department Center  05/29/2021  8:35 AM Reva Bores, MD [facility]  06/05/2021  8:55 AM Reva Bores, MD [facility]  06/12/2021  8:35 AM Reva Bores, MD [facility]    Reva Bores, MD

## 2021-05-22 NOTE — Progress Notes (Signed)
ROB [redacted]w[redacted]d GBS today. Pt would like cervix check today.  BJ:[id number]

## 2021-05-23 LAB — CERVICOVAGINAL ANCILLARY ONLY
Chlamydia: NEGATIVE
Comment: NEGATIVE
Comment: NORMAL
Neisseria Gonorrhea: NEGATIVE

## 2021-05-24 LAB — STREP GP B NAA: Strep Gp B NAA: NEGATIVE

## 2021-05-29 ENCOUNTER — Other Ambulatory Visit: Payer: Self-pay

## 2021-05-29 ENCOUNTER — Ambulatory Visit (INDEPENDENT_AMBULATORY_CARE_PROVIDER_SITE_OTHER): Payer: Medicaid Other | Admitting: Family Medicine

## 2021-05-29 VITALS — BP 132/88 | HR 98 | Wt 199.0 lb

## 2021-05-29 DIAGNOSIS — O0990 Supervision of high risk pregnancy, unspecified, unspecified trimester: Secondary | ICD-10-CM

## 2021-05-29 NOTE — Progress Notes (Signed)
° °  PRENATAL VISIT NOTE  Subjective:  Jamie Escobar is a 21 y.o. G2P1001 at [redacted]w[redacted]d being seen today for ongoing prenatal care.  She is currently monitored for the following issues for this low-risk pregnancy and has Gastroesophageal reflux disease; Supervision of high risk pregnancy, antepartum; Obesity affecting pregnancy; Vaginal bleeding in pregnancy, second trimester; Bleeding; and Obesity (BMI 30-39.9) on their problem list.  Patient reports no complaints.  Contractions: Not present. Vag. Bleeding: None.  Movement: Present. Denies leaking of fluid.   The following portions of the patient's history were reviewed and updated as appropriate: allergies, current medications, past family history, past medical history, past social history, past surgical history and problem list.   Objective:   Vitals:   05/29/21 0839  BP: 132/88  Pulse: 98  Weight: 199 lb (90.3 kg)    Fetal Status: Fetal Heart Rate (bpm): 143 Fundal Height: 34 cm Movement: Present  Presentation: Vertex  General:  Alert, oriented and cooperative. Patient is in no acute distress.  Skin: Skin is warm and dry. No rash noted.   Cardiovascular: Normal heart rate noted  Respiratory: Normal respiratory effort, no problems with respiration noted  Abdomen: Soft, gravid, appropriate for gestational age.  Pain/Pressure: Absent     Pelvic: Cervical exam performed in the presence of a chaperone Dilation: 1 Effacement (%): 70 Station: -2  Extremities: Normal range of motion.  Edema: None  Mental Status: Normal mood and affect. Normal behavior. Normal judgment and thought content.   Assessment and Plan:  Pregnancy: G2P1001 at [redacted]w[redacted]d 1. Supervision of high risk pregnancy, antepartum Continue routine prenatal care. GBS negative  Preterm labor symptoms and general obstetric precautions including but not limited to vaginal bleeding, contractions, leaking of fluid and fetal movement were reviewed in detail with the patient. Please  refer to After Visit Summary for other counseling recommendations.   Return in 4 weeks (on 06/26/2021).  Future Appointments  Date Time Provider Department Center  06/05/2021  8:55 AM Reva Bores, MD [facility]  06/12/2021  8:35 AM Reva Bores, MD [facility]    Reva Bores, MD

## 2021-05-29 NOTE — Patient Instructions (Signed)

## 2021-05-30 ENCOUNTER — Inpatient Hospital Stay (HOSPITAL_COMMUNITY)
Admission: AD | Admit: 2021-05-30 | Discharge: 2021-05-31 | Disposition: A | Discharge status: Home / Self Care | Payer: Medicaid Other | Attending: Obstetrics & Gynecology | Admitting: Obstetrics & Gynecology

## 2021-05-30 ENCOUNTER — Other Ambulatory Visit: Payer: Self-pay

## 2021-05-30 ENCOUNTER — Encounter (HOSPITAL_COMMUNITY): Payer: Self-pay | Admitting: Obstetrics & Gynecology

## 2021-05-30 DIAGNOSIS — E669 Obesity, unspecified: Secondary | ICD-10-CM | POA: Insufficient documentation

## 2021-05-30 DIAGNOSIS — O99213 Obesity complicating pregnancy, third trimester: Secondary | ICD-10-CM | POA: Insufficient documentation

## 2021-05-30 DIAGNOSIS — Z3A38 38 weeks gestation of pregnancy: Secondary | ICD-10-CM | POA: Insufficient documentation

## 2021-05-30 NOTE — MAU Note (Signed)
Pt reports ctx q 3 min. Ctx since 11am. Denies any vag bleeding or leaking good fetal movement felt. 1cm earlier this week in office.

## 2021-05-31 DIAGNOSIS — E669 Obesity, unspecified: Secondary | ICD-10-CM | POA: Diagnosis not present

## 2021-05-31 DIAGNOSIS — Z3A38 38 weeks gestation of pregnancy: Secondary | ICD-10-CM | POA: Diagnosis not present

## 2021-05-31 DIAGNOSIS — O99213 Obesity complicating pregnancy, third trimester: Secondary | ICD-10-CM | POA: Diagnosis not present

## 2021-06-05 ENCOUNTER — Other Ambulatory Visit: Payer: Self-pay

## 2021-06-05 ENCOUNTER — Ambulatory Visit (INDEPENDENT_AMBULATORY_CARE_PROVIDER_SITE_OTHER): Payer: Medicaid Other | Admitting: Family Medicine

## 2021-06-05 VITALS — BP 128/84 | HR 83 | Wt 198.0 lb

## 2021-06-05 DIAGNOSIS — Z3A38 38 weeks gestation of pregnancy: Secondary | ICD-10-CM

## 2021-06-05 DIAGNOSIS — O0990 Supervision of high risk pregnancy, unspecified, unspecified trimester: Secondary | ICD-10-CM

## 2021-06-05 DIAGNOSIS — K219 Gastro-esophageal reflux disease without esophagitis: Secondary | ICD-10-CM

## 2021-06-05 NOTE — Progress Notes (Signed)
° °  PRENATAL VISIT NOTE  Subjective:  Jamie Escobar is a 21 y.o. G2P1001 at [redacted]w[redacted]d being seen today for ongoing prenatal care.  She is currently monitored for the following issues for this low-risk pregnancy and has Gastroesophageal reflux disease; Supervision of high risk pregnancy, antepartum; Obesity affecting pregnancy; Vaginal bleeding in pregnancy, second trimester; Bleeding; and Obesity (BMI 30-39.9) on their problem list.  Patient reports no complaints.  Contractions: Irregular. Vag. Bleeding: None.  Movement: Present. Denies leaking of fluid.   The following portions of the patient's history were reviewed and updated as appropriate: allergies, current medications, past family history, past medical history, past social history, past surgical history and problem list.   Objective:   Vitals:   06/05/21 0858  BP: 128/84  Pulse: 83  Weight: 198 lb (89.8 kg)    Fetal Status: Fetal Heart Rate (bpm): 144 Fundal Height: 35 cm Movement: Present  Presentation: Vertex  General:  Alert, oriented and cooperative. Patient is in no acute distress.  Skin: Skin is warm and dry. No rash noted.   Cardiovascular: Normal heart rate noted  Respiratory: Normal respiratory effort, no problems with respiration noted  Abdomen: Soft, gravid, appropriate for gestational age.  Pain/Pressure: Present     Pelvic: Cervical exam deferred        Extremities: Normal range of motion.  Edema: None  Mental Status: Normal mood and affect. Normal behavior. Normal judgment and thought content.   Assessment and Plan:  Pregnancy: G2P1001 at [redacted]w[redacted]d 1. [redacted] weeks gestation of pregnancy   2. Supervision of high risk pregnancy, antepartum Continue routine prenatal care.   3. Gastroesophageal reflux disease without esophagitis On Protonix, working well  Term labor symptoms and general obstetric precautions including but not limited to vaginal bleeding, contractions, leaking of fluid and fetal movement were  reviewed in detail with the patient. Please refer to After Visit Summary for other counseling recommendations.   Return in 1 week (on 06/12/2021).  Future Appointments  Date Time Provider Department Center  06/12/2021  8:35 AM Reva Bores, MD [facility]    Reva Bores, MD

## 2021-06-05 NOTE — Patient Instructions (Signed)

## 2021-06-08 ENCOUNTER — Observation Stay
Admission: EM | Admit: 2021-06-08 | Discharge: 2021-06-09 | Disposition: A | Discharge status: Home / Self Care | Payer: Medicaid Other | Attending: Obstetrics and Gynecology | Admitting: Obstetrics and Gynecology

## 2021-06-08 ENCOUNTER — Encounter: Payer: Self-pay | Admitting: Obstetrics and Gynecology

## 2021-06-08 DIAGNOSIS — O4693 Antepartum hemorrhage, unspecified, third trimester: Secondary | ICD-10-CM | POA: Insufficient documentation

## 2021-06-08 DIAGNOSIS — Z3A38 38 weeks gestation of pregnancy: Secondary | ICD-10-CM | POA: Insufficient documentation

## 2021-06-08 DIAGNOSIS — N858 Other specified noninflammatory disorders of uterus: Secondary | ICD-10-CM | POA: Diagnosis present

## 2021-06-08 NOTE — OB Triage Note (Signed)
Patient arrived to unit with complaints of contractions every 3-4 minutes. Pt is G2P1 [redacted]w[redacted]d. Patietn receives care are [employer]. Patient reports no symptoms associated with LOF or vaginal bleeding. Patient reports active fetal movement. Patient placed on EFM and Toco to non tender area of abdomen. Patient  notified of visitation policy. Will notify provider of patients arrival

## 2021-06-08 NOTE — OB Triage Note (Signed)
Called Dr. Valentino Saxon and SBAR given. New orders were given to monitor patient on EFM and contractions pattern and recheck in 2 hours. Call with results.

## 2021-06-09 ENCOUNTER — Inpatient Hospital Stay (HOSPITAL_COMMUNITY): Payer: Medicaid Other | Admitting: Anesthesiology

## 2021-06-09 ENCOUNTER — Encounter (HOSPITAL_COMMUNITY): Payer: Self-pay | Admitting: Obstetrics & Gynecology

## 2021-06-09 ENCOUNTER — Inpatient Hospital Stay (HOSPITAL_COMMUNITY)
Admission: AD | Admit: 2021-06-09 | Discharge: 2021-06-10 | DRG: 807 | Disposition: A | Discharge status: Home / Self Care | Payer: Medicaid Other | Attending: Obstetrics & Gynecology | Admitting: Obstetrics & Gynecology

## 2021-06-09 ENCOUNTER — Other Ambulatory Visit: Payer: Self-pay

## 2021-06-09 DIAGNOSIS — O99214 Obesity complicating childbirth: Secondary | ICD-10-CM | POA: Diagnosis present

## 2021-06-09 DIAGNOSIS — Z87891 Personal history of nicotine dependence: Secondary | ICD-10-CM | POA: Diagnosis not present

## 2021-06-09 DIAGNOSIS — R109 Unspecified abdominal pain: Secondary | ICD-10-CM | POA: Diagnosis not present

## 2021-06-09 DIAGNOSIS — Z20822 Contact with and (suspected) exposure to covid-19: Secondary | ICD-10-CM | POA: Diagnosis present

## 2021-06-09 DIAGNOSIS — O26893 Other specified pregnancy related conditions, third trimester: Secondary | ICD-10-CM | POA: Diagnosis present

## 2021-06-09 DIAGNOSIS — O4693 Antepartum hemorrhage, unspecified, third trimester: Secondary | ICD-10-CM | POA: Diagnosis not present

## 2021-06-09 DIAGNOSIS — Z3A39 39 weeks gestation of pregnancy: Secondary | ICD-10-CM | POA: Diagnosis not present

## 2021-06-09 DIAGNOSIS — N858 Other specified noninflammatory disorders of uterus: Secondary | ICD-10-CM | POA: Diagnosis present

## 2021-06-09 DIAGNOSIS — Z3A38 38 weeks gestation of pregnancy: Secondary | ICD-10-CM | POA: Diagnosis not present

## 2021-06-09 DIAGNOSIS — O4292 Full-term premature rupture of membranes, unspecified as to length of time between rupture and onset of labor: Principal | ICD-10-CM | POA: Diagnosis present

## 2021-06-09 LAB — CBC
HCT: 37.9 % (ref 36.0–46.0)
Hemoglobin: 12.5 g/dL (ref 12.0–15.0)
MCH: 27.8 pg (ref 26.0–34.0)
MCHC: 33 g/dL (ref 30.0–36.0)
MCV: 84.4 fL (ref 80.0–100.0)
Platelets: 208 10*3/uL (ref 150–400)
RBC: 4.49 MIL/uL (ref 3.87–5.11)
RDW: 13.4 % (ref 11.5–15.5)
WBC: 11.1 10*3/uL — ABNORMAL HIGH (ref 4.0–10.5)
nRBC: 0 % (ref 0.0–0.2)

## 2021-06-09 LAB — RESP PANEL BY RT-PCR (FLU A&B, COVID) ARPGX2
Influenza A by PCR: NEGATIVE
Influenza B by PCR: NEGATIVE
SARS Coronavirus 2 by RT PCR: NEGATIVE

## 2021-06-09 LAB — TYPE AND SCREEN
ABO/RH(D): O POS
Antibody Screen: NEGATIVE

## 2021-06-09 LAB — RPR: RPR Ser Ql: NONREACTIVE

## 2021-06-09 MED ORDER — LACTATED RINGERS IV SOLN: 500.0000 mL | INTRAVENOUS | Status: DC | PRN

## 2021-06-09 MED ORDER — ACETAMINOPHEN 325 MG PO TABS: 650.0000 mg | ORAL_TABLET | ORAL | Status: DC | PRN

## 2021-06-09 MED ORDER — FENTANYL-BUPIVACAINE-NACL 0.5-0.125-0.9 MG/250ML-% EP SOLN
12.0000 mL/h | EPIDURAL | Status: DC | PRN
  Filled 2021-06-09: qty 250

## 2021-06-09 MED ORDER — LIDOCAINE HCL (PF) 1 % IJ SOLN
INTRAMUSCULAR | Status: DC | PRN
Start: 2021-06-09 — End: 2021-06-09
  Administered 2021-06-09: 2 mL via EPIDURAL
  Administered 2021-06-09: 10 mL via EPIDURAL

## 2021-06-09 MED ORDER — IBUPROFEN 600 MG PO TABS
600.0000 mg | ORAL_TABLET | Freq: Four times a day (QID) | ORAL | Status: DC
  Administered 2021-06-09 – 2021-06-10 (×3): 600 mg via ORAL
  Filled 2021-06-09 (×4): qty 1

## 2021-06-09 MED ORDER — OXYCODONE-ACETAMINOPHEN 5-325 MG PO TABS: 1.0000 | ORAL_TABLET | ORAL | Status: DC | PRN

## 2021-06-09 MED ORDER — DIBUCAINE (PERIANAL) 1 % EX OINT: 1.0000 "application " | TOPICAL_OINTMENT | CUTANEOUS | Status: DC | PRN

## 2021-06-09 MED ORDER — PHENYLEPHRINE 40 MCG/ML (10ML) SYRINGE FOR IV PUSH (FOR BLOOD PRESSURE SUPPORT): 80.0000 ug | PREFILLED_SYRINGE | INTRAVENOUS | Status: DC | PRN

## 2021-06-09 MED ORDER — LACTATED RINGERS IV SOLN: INTRAVENOUS | Status: DC

## 2021-06-09 MED ORDER — ACETAMINOPHEN 325 MG PO TABS
650.0000 mg | ORAL_TABLET | ORAL | Status: DC | PRN
  Administered 2021-06-10: 650 mg via ORAL
  Filled 2021-06-09 (×2): qty 2

## 2021-06-09 MED ORDER — MEASLES, MUMPS & RUBELLA VAC IJ SOLR: 0.5000 mL | Freq: Once | INTRAMUSCULAR | Status: DC

## 2021-06-09 MED ORDER — OXYTOCIN BOLUS FROM INFUSION
333.0000 mL | Freq: Once | INTRAVENOUS | Status: AC
  Administered 2021-06-09: 333 mL via INTRAVENOUS

## 2021-06-09 MED ORDER — SIMETHICONE 80 MG PO CHEW
80.0000 mg | CHEWABLE_TABLET | ORAL | Status: DC | PRN
  Administered 2021-06-10: 80 mg via ORAL
  Filled 2021-06-09: qty 1

## 2021-06-09 MED ORDER — OXYTOCIN-SODIUM CHLORIDE 30-0.9 UT/500ML-% IV SOLN
2.5000 [IU]/h | INTRAVENOUS | Status: DC
  Filled 2021-06-09: qty 500

## 2021-06-09 MED ORDER — EPHEDRINE 5 MG/ML INJ: 10.0000 mg | INTRAVENOUS | Status: DC | PRN

## 2021-06-09 MED ORDER — LIDOCAINE HCL (PF) 1 % IJ SOLN: 30.0000 mL | INTRAMUSCULAR | Status: DC | PRN

## 2021-06-09 MED ORDER — WITCH HAZEL-GLYCERIN EX PADS: 1.0000 "application " | MEDICATED_PAD | CUTANEOUS | Status: DC | PRN

## 2021-06-09 MED ORDER — OXYCODONE-ACETAMINOPHEN 5-325 MG PO TABS: 2.0000 | ORAL_TABLET | ORAL | Status: DC | PRN

## 2021-06-09 MED ORDER — ONDANSETRON HCL 4 MG/2ML IJ SOLN
4.0000 mg | Freq: Four times a day (QID) | INTRAMUSCULAR | Status: DC | PRN
  Administered 2021-06-09: 4 mg via INTRAVENOUS
  Filled 2021-06-09: qty 2

## 2021-06-09 MED ORDER — SOD CITRATE-CITRIC ACID 500-334 MG/5ML PO SOLN: 30.0000 mL | ORAL | Status: DC | PRN

## 2021-06-09 MED ORDER — FENTANYL-BUPIVACAINE-NACL 0.5-0.125-0.9 MG/250ML-% EP SOLN
EPIDURAL | Status: DC | PRN
  Administered 2021-06-09: 12 mL/h via EPIDURAL

## 2021-06-09 MED ORDER — ONDANSETRON HCL 4 MG PO TABS: 4.0000 mg | ORAL_TABLET | ORAL | Status: DC | PRN

## 2021-06-09 MED ORDER — MEDROXYPROGESTERONE ACETATE 150 MG/ML IM SUSP
150.0000 mg | Freq: Once | INTRAMUSCULAR | Status: AC
  Administered 2021-06-10: 150 mg via INTRAMUSCULAR
  Filled 2021-06-09: qty 1

## 2021-06-09 MED ORDER — PRENATAL MULTIVITAMIN CH
1.0000 | ORAL_TABLET | Freq: Every day | ORAL | Status: DC
  Administered 2021-06-09 – 2021-06-10 (×2): 1 via ORAL
  Filled 2021-06-09 (×2): qty 1

## 2021-06-09 MED ORDER — ONDANSETRON HCL 4 MG/2ML IJ SOLN: 4.0000 mg | INTRAMUSCULAR | Status: DC | PRN

## 2021-06-09 MED ORDER — DIPHENHYDRAMINE HCL 25 MG PO CAPS: 25.0000 mg | ORAL_CAPSULE | Freq: Four times a day (QID) | ORAL | Status: DC | PRN

## 2021-06-09 MED ORDER — MAGNESIUM HYDROXIDE 400 MG/5ML PO SUSP
30.0000 mL | ORAL | Status: DC | PRN
  Administered 2021-06-09: 30 mL via ORAL
  Filled 2021-06-09: qty 30

## 2021-06-09 MED ORDER — OXYTOCIN-SODIUM CHLORIDE 30-0.9 UT/500ML-% IV SOLN
1.0000 m[IU]/min | INTRAVENOUS | Status: DC
  Administered 2021-06-09: 2 m[IU]/min via INTRAVENOUS

## 2021-06-09 MED ORDER — BENZOCAINE-MENTHOL 20-0.5 % EX AERO: 1.0000 "application " | INHALATION_SPRAY | CUTANEOUS | Status: DC | PRN

## 2021-06-09 MED ORDER — TERBUTALINE SULFATE 1 MG/ML IJ SOLN: 0.2500 mg | Freq: Once | INTRAMUSCULAR | Status: DC | PRN

## 2021-06-09 MED ORDER — DIPHENHYDRAMINE HCL 50 MG/ML IJ SOLN: 12.5000 mg | INTRAMUSCULAR | Status: DC | PRN

## 2021-06-09 MED ORDER — LACTATED RINGERS IV SOLN
500.0000 mL | Freq: Once | INTRAVENOUS | Status: AC
  Administered 2021-06-09: 500 mL via INTRAVENOUS

## 2021-06-09 MED ORDER — TETANUS-DIPHTH-ACELL PERTUSSIS 5-2.5-18.5 LF-MCG/0.5 IM SUSY: 0.5000 mL | PREFILLED_SYRINGE | Freq: Once | INTRAMUSCULAR | Status: DC

## 2021-06-09 MED ORDER — COCONUT OIL OIL: 1.0000 "application " | TOPICAL_OIL | Status: DC | PRN

## 2021-06-09 NOTE — Lactation Note (Signed)
This note was copied from a baby's chart. Lactation Consultation Note  Patient Name: Jamie Escobar JASNK'N Date: 06/09/2021 Reason for consult: Initial assessment Age:22 hours  P2, Mother denies questions or concerns. Plans to breastfeed at first then offer formula also. Knows to call if assistance is needed. Feed on demand with cues.  Goal 8-12+ times per day after first 24 hrs.  Place baby STS if not cueing.  Mom made aware of O/P services, breastfeeding support groups, community resources, and our phone # for post-discharge questions.    Maternal Data Has patient been taught Hand Expression?: Yes Does the patient have breastfeeding experience prior to this delivery?: Yes How long did the patient breastfeed?: 2 weeks  Feeding Mother's Current Feeding Choice: Breast Milk and Formula  LATCH Score Latch: Grasps breast easily, tongue down, lips flanged, rhythmical sucking.  Audible Swallowing: None  Type of Nipple: Everted at rest and after stimulation  Comfort (Breast/Nipple): Soft / non-tender  Hold (Positioning): No assistance needed to correctly position infant at breast.  LATCH Score: 8   Lactation Tools Discussed/Used    Interventions Interventions: Breast feeding basics reviewed;Education;LC Services brochure  Discharge    Consult Status Consult Status: Follow-up Date: 06/10/21 Follow-up type: In-patient    Dahlia Byes [facility] 06/09/2021, 1:40 PM

## 2021-06-09 NOTE — MAU Note (Signed)
Duplicate note created.

## 2021-06-09 NOTE — Discharge Summary (Addendum)
Postpartum Discharge Summary    Patient Name: Jamie Escobar DOB: October 29, 2000 MRN: [id number]  Date of admission: 06/09/2021 Delivery date:06/09/2021  Delivering provider: Manya Silvas  Date of discharge: 06/10/2021  Admitting diagnosis: Full-term premature rupture of membranes [O42.92] Intrauterine pregnancy: [redacted]w[redacted]d    Secondary diagnosis:  Principal Problem:   Full-term premature rupture of membranes Active Problems:   Vaginal delivery Obesity  Additional problems: none    Discharge diagnosis: Term Pregnancy Delivered                                              Post partum procedures: none Augmentation: Pitocin Complications: None  Hospital course: Onset of Labor With Vaginal Delivery      21y.o. yo G2P1001 at 335w0das admitted in Latent Labor on 06/09/2021. Patient had an uncomplicated labor course as follows:  Membrane Rupture Time/Date: 4:30 AM ,06/09/2021   Delivery Method:Vaginal, Spontaneous  Episiotomy: None  Lacerations:  None  Patient had an uncomplicated postpartum course.  She is ambulating, tolerating a regular diet, passing flatus, and urinating well. Patient is discharged home in stable condition on 06/10/21.  Newborn Data: Birth date:06/09/2021  Birth time:10:45 AM  Gender:Female  Living status:Living  Apgars:9 ,9  Weight:3295 g   Magnesium Sulfate received: No BMZ received: No Rhophylac:N/A MMR:N/A T-DaP:Given prenatally Flu: Yes Transfusion:No  Physical exam  Vitals:   06/09/21 1400 06/09/21 1835 06/10/21 0041 06/10/21 0621  BP: 117/67 113/69 118/78 124/87  Pulse: 68 77 70 64  Resp: _0 Temp: 98.6 F (37 C) 98.8 F (37.1 C) 98.1 F (36.7 C) 97.8 F (36.6 C)  TempSrc: Oral Oral Oral Oral  SpO2: 99% 98% 99% 100%  Weight:      Height:       General: alert, cooperative, and no distress Lochia: appropriate Uterine Fundus: firm Incision: N/A DVT Evaluation: No significant calf/ankle edema. Labs: Lab Results  Component  Value Date   WBC 11.1 (H) 06/09/2021   HGB 12.5 06/09/2021   HCT 37.9 06/09/2021   MCV 84.4 06/09/2021   PLT 208 06/09/2021   CMP Latest Ref Rng & Units 10/23/2017  Glucose 65 - 99 mg/dL 81  BUN 7 - 20 mg/dL 14  Creatinine 0.50 - 1.00 mg/dL 1.12(H)  Sodium 135 - 146 mmol/L 142  Potassium 3.8 - 5.1 mmol/L 4.6  Chloride 98 - 110 mmol/L 109  CO2 20 - 32 mmol/L 24  Calcium 8.9 - 10.4 mg/dL 9.6  Total Protein 6.3 - 8.2 g/dL 6.6  Total Bilirubin 0.2 - 1.1 mg/dL 0.5  Alkaline Phos 47 - 119 U/L -  AST 12 - 32 U/L 14  ALT 5 - 32 U/L 10   Edinburgh Score: Edinburgh Postnatal Depression Scale Screening Tool 06/09/2021  I have been able to laugh and see the funny side of things. 0  I have looked forward with enjoyment to things. 0  I have blamed myself unnecessarily when things went wrong. 0  I have been anxious or worried for no good reason. 0  I have felt scared or panicky for no good reason. 0  Things have been getting on top of me. 0  I have been so unhappy that I have had difficulty sleeping. 0  I have felt sad or miserable. 0  I have been so unhappy that I have been crying.  0  The thought of harming myself has occurred to me. 0  Edinburgh Postnatal Depression Scale Total 0     After visit meds:  Allergies as of 06/10/2021   No Known Allergies      Medication List     STOP taking these medications    aspirin EC 81 MG tablet   Blood Pressure Kit   pantoprazole 40 MG tablet Commonly known as: Protonix   triamcinolone ointment 0.5 % Commonly known as: KENALOG       TAKE these medications    acetaminophen 325 MG tablet Commonly known as: Tylenol Take 2 tablets (650 mg total) by mouth every 4 (four) hours as needed (for pain scale < 4).   ibuprofen 600 MG tablet Commonly known as: ADVIL Take 1 tablet (600 mg total) by mouth every 6 (six) hours.   PrePLUS 27-1 MG Tabs Take 1 tablet by mouth daily.         Discharge home in stable condition Infant  Feeding: Breast Infant Disposition:home with mother Discharge instruction: per After Visit Summary and Postpartum booklet. Activity: Advance as tolerated. Pelvic rest for 6 weeks.  Diet: routine diet Future Appointments: Future Appointments  Date Time Provider [place]  06/12/2021  8:35 AM Donnamae Jude, MD [facility]   Follow up Visit:  [place] [facility]. Go on 06/12/2021.   Specialty: Obstetrics and Gynecology Why: Appointment time is 8:35 Contact information: [place] [phone]                 Please schedule this patient for a Virtual postpartum visit in 4 weeks with the following provider: Any provider. Additional Postpartum F/U: None   Low risk pregnancy complicated by:  Nothing Delivery mode:  Vaginal, Spontaneous  Anticipated Birth Control:  Depo   06/10/2021 Precious Gilding, DO   Midwife attestation I have seen and examined this patient and agree with above documentation in the resident's note.   Jamie Escobar is a 21 y.o. [id number] s/p SVD.  Pain is well controlled. Plan for birth control is Depo-Provera. Method of Feeding: breast  PE:  Gen: well appearing Heart: reg rate Lungs: normal WOB Fundus firm Ext: no pain, no edema  Recent Labs    06/09/21 0528  HGB 12.5  HCT 37.9    Assessment S/p SVD PPD # 1  Plan: - discharge home - postpartum care discussed - f/u in office in 6 weeks for postpartum visit   Julianne Handler, CNM 11:26 AM

## 2021-06-09 NOTE — Anesthesia Postprocedure Evaluation (Signed)
Anesthesia Post Note  Patient: Jamie Escobar  Procedure(s) Performed: AN AD HOC LABOR EPIDURAL     Patient location during evaluation: Mother Baby Anesthesia Type: Epidural Level of consciousness: awake and alert Pain management: pain level controlled Vital Signs Assessment: post-procedure vital signs reviewed and stable Respiratory status: spontaneous breathing, nonlabored ventilation and respiratory function stable Cardiovascular status: stable Postop Assessment: no headache, no backache, epidural receding, no apparent nausea or vomiting, patient able to bend at knees, adequate PO intake and able to ambulate Anesthetic complications: no   No notable events documented.  Last Vitals:  Vitals:   06/09/21 1300 06/09/21 1400  BP: 112/70 117/67  Pulse: 64 68  Resp: 18 18  Temp: 37.4 C 37 C  SpO2: 100% 99%    Last Pain:  Vitals:   06/09/21 1400  TempSrc: Oral  PainSc:    Pain Goal:                   [employer]

## 2021-06-09 NOTE — OB Triage Note (Signed)
Notified provider of patient's recent vaginal check. Provider gave verbal order for patient to be discharged to  home and follow up with Outpatient OB for scheduling IOL if desired and labor precautions on when to return for care. Will notify patient.

## 2021-06-09 NOTE — Anesthesia Procedure Notes (Signed)
Epidural Patient location during procedure: OB Start time: 06/09/2021 6:26 AM End time: 06/09/2021 6:34 AM  Staffing Anesthesiologist: Lannie Fields, DO Performed: anesthesiologist   Preanesthetic Checklist Completed: patient identified, IV checked, risks and benefits discussed, monitors and equipment checked, pre-op evaluation and timeout performed  Epidural Patient position: sitting Prep: DuraPrep and site prepped and draped Patient monitoring: continuous pulse ox, blood pressure, heart rate and cardiac monitor Approach: midline Location: L3-L4 Injection technique: LOR air  Needle:  Needle type: Tuohy  Needle gauge: 17 G Needle length: 9 cm Needle insertion depth: 6 cm Catheter type: closed end flexible Catheter size: 19 Gauge Catheter at skin depth: 11 cm Test dose: negative  Assessment Sensory level: T8 Events: blood not aspirated, injection not painful, no injection resistance, no paresthesia and negative IV test  Additional Notes Patient identified. Risks/Benefits/Options discussed with patient including but not limited to bleeding, infection, nerve damage, paralysis, failed block, incomplete pain control, headache, blood pressure changes, nausea, vomiting, reactions to medication both or allergic, itching and postpartum back pain. Confirmed with bedside nurse the patient's most recent platelet count. Confirmed with patient that they are not currently taking any anticoagulation, have any bleeding history or any family history of bleeding disorders. Patient expressed understanding and wished to proceed. All questions were answered. Sterile technique was used throughout the entire procedure. Please see nursing notes for vital signs. Test dose was given through epidural catheter and negative prior to continuing to dose epidural or start infusion. Warning signs of high block given to the patient including shortness of breath, tingling/numbness in hands, complete motor  block, or any concerning symptoms with instructions to call for help. Patient was given instructions on fall risk and not to get out of bed. All questions and concerns addressed with instructions to call with any issues or inadequate analgesia.  Reason for block:procedure for pain

## 2021-06-09 NOTE — H&P (Signed)
OBSTETRIC ADMISSION HISTORY AND PHYSICAL  Jamie Escobar is a 21 y.o. female G2P1001 with IUP at 27w0dby LMP presenting for SROM. She reports +FMs, No LOF, no VB, no blurry vision, headaches or peripheral edema, and RUQ pain.  She plans on breast feeding. She request depo for birth control. [facility]   Dating: By LMP --->  Estimated Date of Delivery: 06/16/21  Sono:    @[redacted]w[redacted]d , CWD, normal anatomy, cephalic presentation, anterior placenta, 312g, 81% EFW   Prenatal History/Complications:  - Obesity  Past Medical History: Past Medical History:  Diagnosis Date   Chronic rhinitis 02/01/2020   Eczema    Hydronephrosis    Menorrhagia with irregular cycle 10/23/2017   Skin lesion 10/23/2017   Urticaria     Past Surgical History: Past Surgical History:  Procedure Laterality Date   FOOT SURGERY Right     Obstetrical History: OB History     Gravida  2   Para  1   Term  1   Preterm      AB      Living  1      SAB      IAB      Ectopic      Multiple  0   Live Births  1           Social History Social History   Socioeconomic History   Marital status: Significant Other    Spouse name: Not on file   Number of children: Not on file   Years of education: Not on file   Highest education level: Not on file  Occupational History   Not on file  Tobacco Use   Smoking status: Former    Types: E-cigarettes   Smokeless tobacco: Never  Vaping Use   Vaping Use: Former   Substances: Nicotine  Substance and Sexual Activity   Alcohol use: No   Drug use: Not Currently    Types: Marijuana   Sexual activity: Yes    Birth control/protection: None  Other Topics Concern   Not on file  Social History Narrative   Not on file   Social Determinants of Health   Financial Resource Strain: Not on file  Food Insecurity: Not on file  Transportation Needs: Not on file  Physical Activity: Not on file  Stress: Not on file  Social  Connections: Not on file    Family History: Family History  Problem Relation Age of Onset   Cancer Father        brain    Diabetes Maternal Grandmother    Heart disease Maternal Grandmother    Allergic rhinitis Neg Hx    Angioedema Neg Hx    Asthma Neg Hx    Atopy Neg Hx    Eczema Neg Hx    Immunodeficiency Neg Hx    Urticaria Neg Hx     Allergies: No Known Allergies  Medications Prior to Admission  Medication Sig Dispense Refill Last Dose   aspirin EC 81 MG tablet Take 1 tablet (81 mg total) by mouth daily. Take after 12 weeks for prevention of preeclampsia later in pregnancy 300 tablet 2 06/08/2021   pantoprazole (PROTONIX) 40 MG tablet Take 1 tablet (40 mg total) by mouth daily. 30 tablet 2 06/08/2021   Prenatal Vit-Fe Fumarate-FA (PREPLUS) 27-1 MG TABS Take 1 tablet by mouth daily. 30 tablet 13 06/08/2021   Blood Pressure KIT 1 Device by Does not apply route once  a week. 1 kit 0    triamcinolone ointment (KENALOG) 0.5 % Apply to affected areas twice daily (Patient not taking: Reported on 02/18/2021)       Review of Systems   All systems reviewed and negative except as stated in HPI  Blood pressure 138/83, pulse 75, resp. rate 18, height 5' 1"  (1.549 m), weight 90.9 kg, last menstrual period 09/09/2020, SpO2 98 %. General appearance: alert and cooperative Lungs: clear to auscultation bilaterally Heart: regular rate and rhythm Abdomen: soft, non-tender; gravid Extremities: no sign of DVT Presentation: cephalic by MAU Fetal monitoring: 145 bpm, moderate variability, +10x10 accels, occasional early decel Uterine activity: Q 2-4 mins Dilation: 4 Effacement (%): 80 Station: -1 Exam by:: Herb Grays, RN  Prenatal labs: ABO, Rh: --/--/O POS (01/29 0525) Antibody: NEG (01/29 0525) Rubella: 1.77 (07/13 1036) RPR: Non Reactive (11/10 0943)  HBsAg: Negative (07/13 1036)  HIV: Non Reactive (11/10 0943)  GBS: Negative/-- (01/11 1022)  2 hr Glucola: 78/81/98 Genetic  screening: LR NIPS, AFP neg Anatomy US: Normal  Prenatal Transfer Tool  Maternal Diabetes: No Genetic Screening: Normal Maternal Ultrasounds/Referrals: Normal Fetal Ultrasounds or other Referrals:  None Maternal Substance Abuse:  No Significant Maternal Medications:  None Significant Maternal Lab Results: Group B Strep negative  Results for orders placed or performed during the hospital encounter of 06/09/21 (from the past 24 hour(s))  Type and screen [place]   Collection Time: 06/09/21  5:25 AM  Result Value Ref Range   ABO/RH(D) O POS    Antibody Screen NEG    Sample Expiration      06/12/2021,2359 Performed at [facility], [address] [place], [place] [address]   CBC   Collection Time: 06/09/21  5:28 AM  Result Value Ref Range   WBC 11.1 (H) 4.0 - 10.5 K/uL   RBC 4.49 3.87 - 5.11 MIL/uL   Hemoglobin 12.5 12.0 - 15.0 g/dL   HCT 37.9 36.0 - 46.0 %   MCV 84.4 80.0 - 100.0 fL   MCH 27.8 26.0 - 34.0 pg   MCHC 33.0 30.0 - 36.0 g/dL   RDW 13.4 11.5 - 15.5 %   Platelets 208 150 - 400 K/uL   nRBC 0.0 0.0 - 0.2 %    Patient Active Problem List   Diagnosis Date Noted   Alteration in comfort associated with uterine contractions 06/09/2021   Full-term premature rupture of membranes 06/09/2021   Obesity (BMI 30-39.9) 04/11/2021   Vaginal bleeding in pregnancy, second trimester 02/18/2021   Bleeding 02/18/2021   Supervision of high risk pregnancy, antepartum 11/21/2020   Obesity affecting pregnancy 11/21/2020   Gastroesophageal reflux disease 10/23/2017    Assessment/Plan:  ALEXXIA STANKIEWICZ is a 21 y.o. G2P1001 at 25w0dhere for SROM  #Labor: Expectant management for the time being. Contracting every 2-4 mins. Consider Pitocin prn #Pain: Desires epidural #FWB: Cat 1 #ID: GBS neg #MOF: Breast #MOC: Depo #Circ: N/A    DRenee Harder CNM  06/09/2021, 6:27 AM

## 2021-06-09 NOTE — MAU Note (Signed)
..  Jamie Escobar is a 21 y.o. at [redacted]w[redacted]d here in MAU reporting: SROM @0430 , yellow and green fluid with bloody show. Ctx since 1900 yesterday. Pt denies DFM, VB, PIH s/s, and complication in the pregnancy. Last intercourse was two days ago. Pt was at [facility] around 0200 for ctx, and was discharged. SVE 2 cm GBS  Neg  Onset of complaint: 1900 labor Pain score: 6/10 Vitals:   06/09/21 0502 06/09/21 0504  BP:  130/77  Pulse:  (!) 103  Resp:  18  SpO2: 98%      FHT:145  Lab orders placed from triage:  none

## 2021-06-09 NOTE — OB Triage Note (Signed)
Patient was discharged to home with FOB at side. Pateint given AVs and verbalized understanding to call outpatient OB office to schedule IOL if desired and labor precautions in which to return. Patient left unit ambulatory to be driven in private passenger vehicle with all personal belongings including clothing, shoes, cell phone, and purse. L&D triage complete. I, RN sgn off on care.

## 2021-06-09 NOTE — Anesthesia Preprocedure Evaluation (Signed)
Anesthesia Evaluation  Patient identified by MRN, date of birth, ID band Patient awake    Reviewed: Allergy & Precautions, Patient's Chart, lab work & pertinent test results  Airway Mallampati: II  TM Distance: >3 FB Neck ROM: Full    Dental no notable dental hx.    Pulmonary former smoker,    Pulmonary exam normal breath sounds clear to auscultation       Cardiovascular negative cardio ROS Normal cardiovascular exam Rhythm:Regular Rate:Normal     Neuro/Psych negative neurological ROS  negative psych ROS   GI/Hepatic Neg liver ROS, GERD  Medicated and Controlled,  Endo/Other  negative endocrine ROSBMI 38  Renal/GU negative Renal ROS  negative genitourinary   Musculoskeletal negative musculoskeletal ROS (+)   Abdominal   Peds negative pediatric ROS (+)  Hematology negative hematology ROS (+) hct 37.9, plt 208   Anesthesia Other Findings   Reproductive/Obstetrics negative OB ROS                             Anesthesia Physical Anesthesia Plan  ASA: 2  Anesthesia Plan: Epidural   Post-op Pain Management:    Induction:   PONV Risk Score and Plan: 2  Airway Management Planned: Natural Airway  Additional Equipment: None  Intra-op Plan:   Post-operative Plan:   Informed Consent: I have reviewed the patients History and Physical, chart, labs and discussed the procedure including the risks, benefits and alternatives for the proposed anesthesia with the patient or authorized representative who has indicated his/her understanding and acceptance.       Plan Discussed with:   Anesthesia Plan Comments:         Anesthesia Quick Evaluation

## 2021-06-10 MED ORDER — ACETAMINOPHEN 325 MG PO TABS: 650.0000 mg | ORAL_TABLET | ORAL | Status: DC | PRN

## 2021-06-10 MED ORDER — IBUPROFEN 600 MG PO TABS: 600.0000 mg | ORAL_TABLET | Freq: Four times a day (QID) | ORAL | 0 refills | Status: DC

## 2021-06-10 NOTE — Lactation Note (Signed)
This note was copied from a baby's chart. Lactation Consultation Note  Patient Name: Jamie Escobar ACZYS'A Date: 06/10/2021   Age:22 hours  Mom & RN report that infant has been really fussy today and Mom inquired if she should pump & BO. I provided a size 21 flange & Mom was able to express some colostrum easily. Infant began stirring and did latch briefly and had multiple, frequent swallows during that time (swallows verified by cervical auscultation). As infant only latched for a short time, I asked Mom to call for me to return so we could troubleshoot some more.   Mom reports having a DEBP at home.       Lurline Hare [facility] 06/10/2021, 11:29 AM

## 2021-06-12 ENCOUNTER — Encounter: Payer: Medicaid Other | Admitting: Family Medicine

## 2021-06-14 NOTE — Discharge Summary (Signed)
L&D OB Triage Note  Jamie Escobar is an unassigned 21 y.o. [id number] female at [redacted]w[redacted]d, EDD Estimated Date of Delivery: 06/16/21 who presented to triage for complaints of contractions every 3-4 minutes. She receives prenatal care at [facility].  She was evaluated by the nurses with no significant findings for active labor. Vital signs stable. An NST was performed and has been reviewed by MD.    Physical Exam:  Blood pressure 122/73, pulse 70, temperature 98.7 F (37.1 C), temperature source Oral, resp. rate 14, last menstrual period 09/09/2020, unknown if currently breastfeeding. Dilation: 2.5 Effacement (%): 80 Station: -2 Presentation: Vertex Exam by:: D. Means, RN   NST INTERPRETATION: Indications: rule out uterine contractions  Mode: External (removed for anticipatory d/c to home) Baseline Rate (A): 135 bpm Variability: Moderate Accelerations: 15 x 15 Decelerations: None     Contraction Frequency (min): 6-7  Impression: reactive   Plan: NST performed was reviewed and was found to be reactive. No cervical change noted after 2 hours. Contraction pattern spaced out. She was discharged home with bleeding/labor precautions.  Continue routine prenatal care. Follow up with OB/GYN as previously scheduled.     Hildred Laser, MD [facility]

## 2021-06-21 ENCOUNTER — Telehealth (HOSPITAL_COMMUNITY): Payer: Self-pay | Admitting: *Deleted

## 2021-06-21 NOTE — Telephone Encounter (Signed)
Phone voicemail message left to return nurse call.  Duffy Rhody, RN 06-21-2021 at 11:02am

## 2021-07-09 ENCOUNTER — Telehealth: Payer: Medicaid Other | Admitting: Obstetrics & Gynecology

## 2021-07-09 ENCOUNTER — Other Ambulatory Visit: Payer: Self-pay

## 2021-12-09 IMAGING — US US MFM OB DETAIL+14 WK
1 series · 13 of 28 positions shown · non-contrast
Comparison: none

[Series 1: us mfm ob detail+14 wk · 13 of 146 slices shown]
[im 6/146]
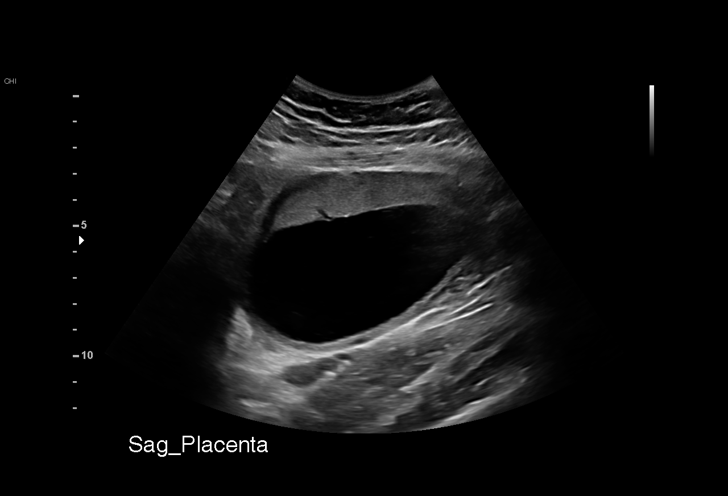
[im 17/146]
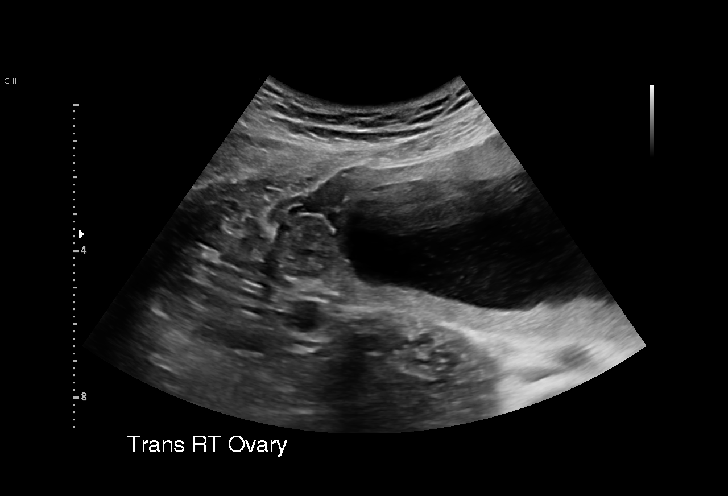
[im 27/146]
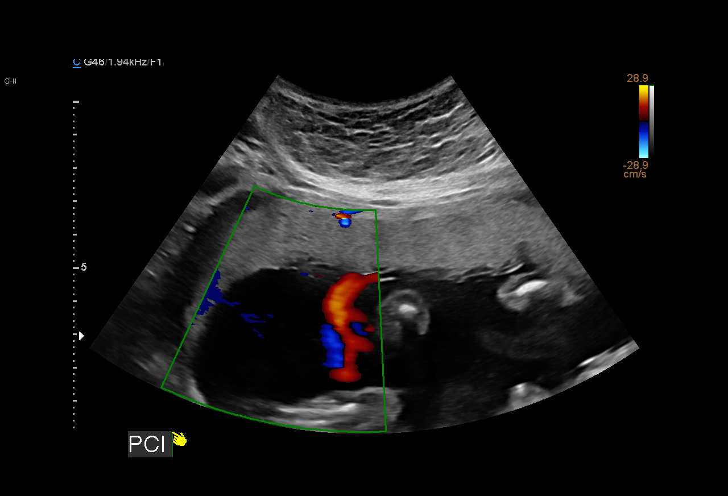
[im 38/146]
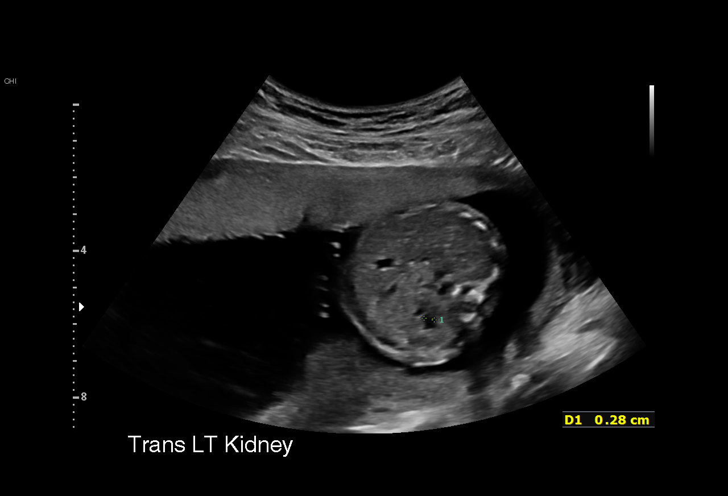
[im 49/146]
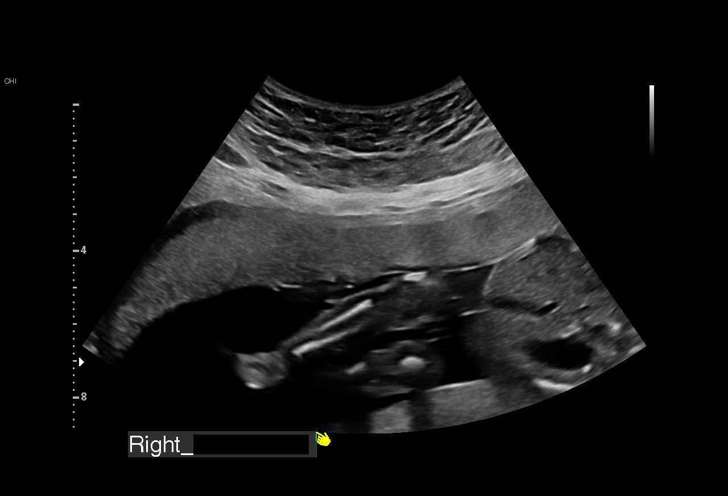
[im 60/146]
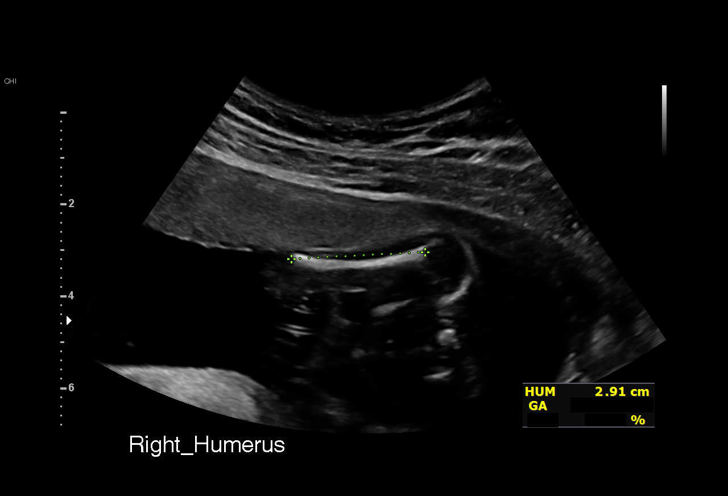
[im 76/146]
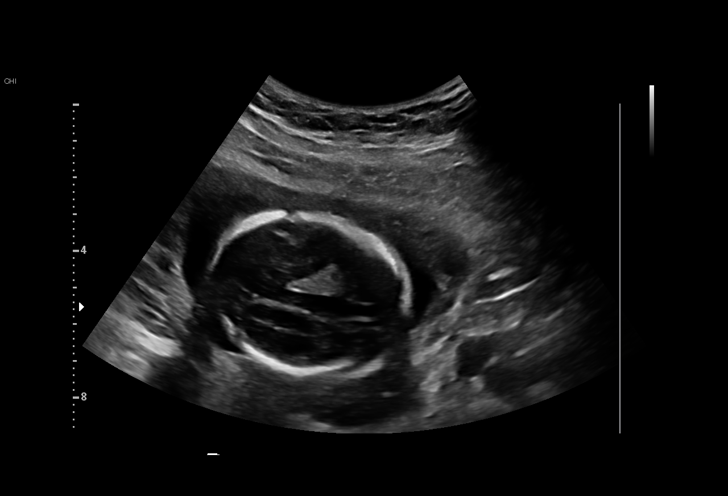
[im 86/146]
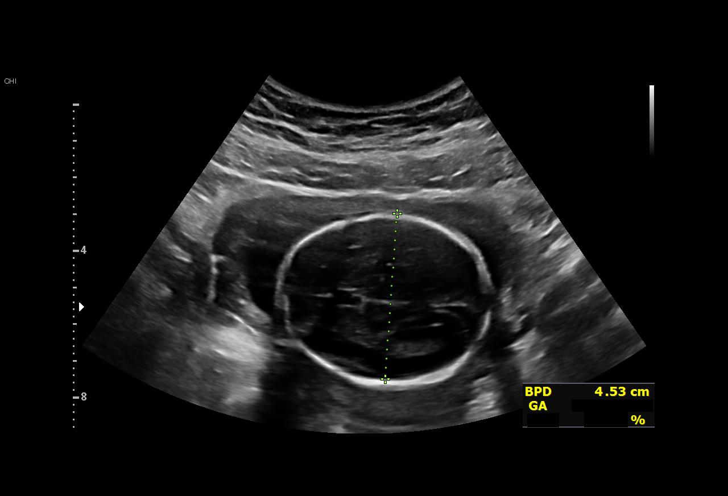
[im 97/146]
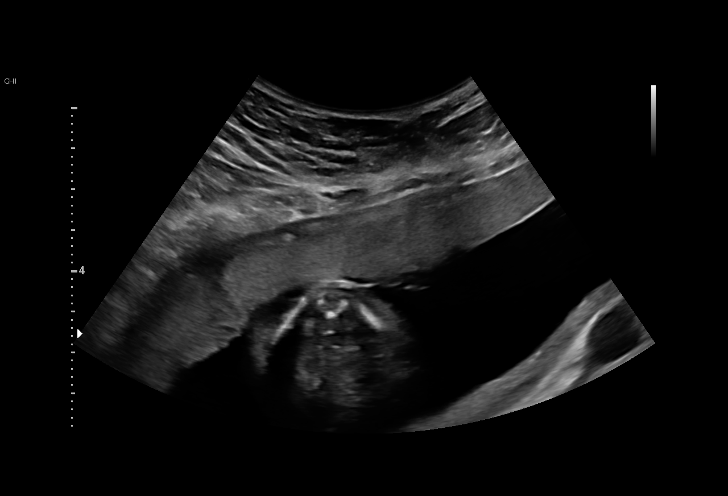
[im 108/146]
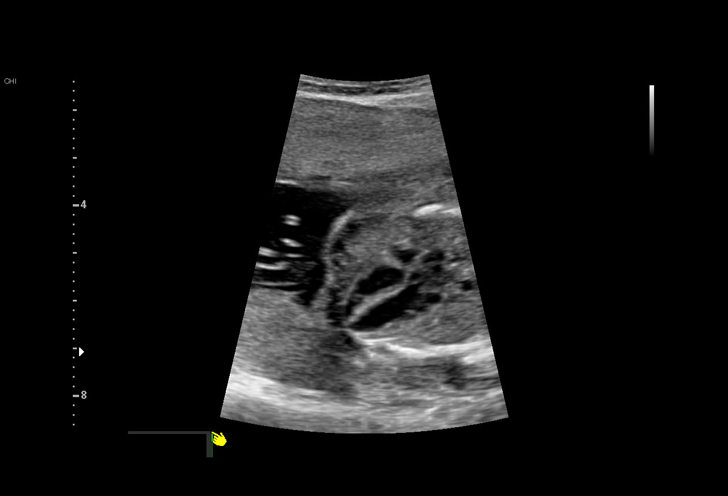
[im 119/146]
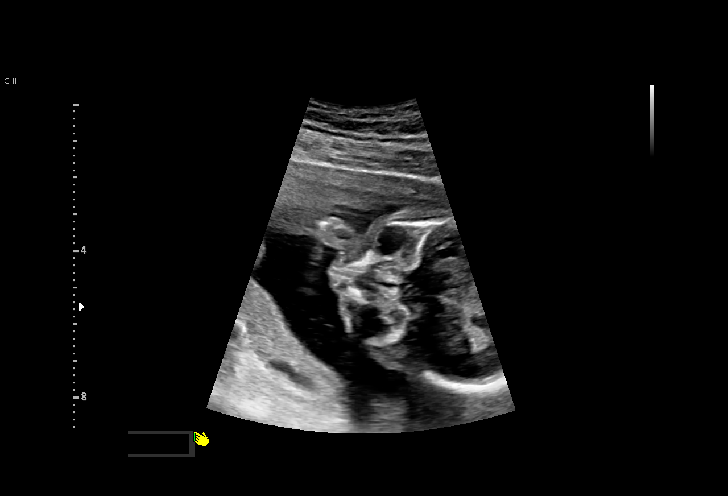
[im 129/146]
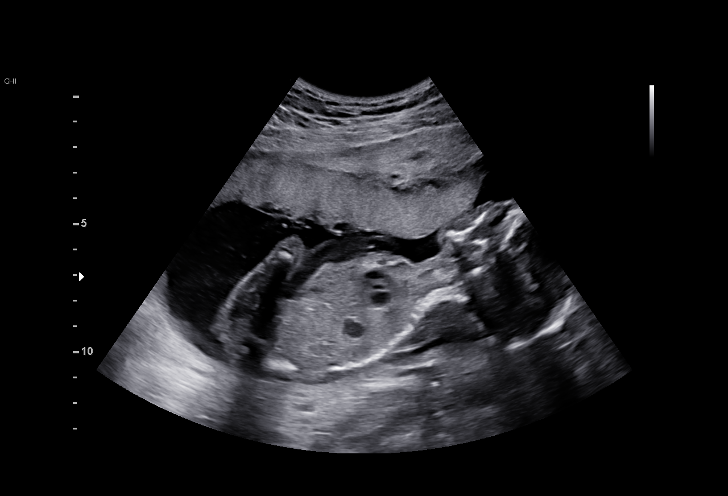
[im 140/146]
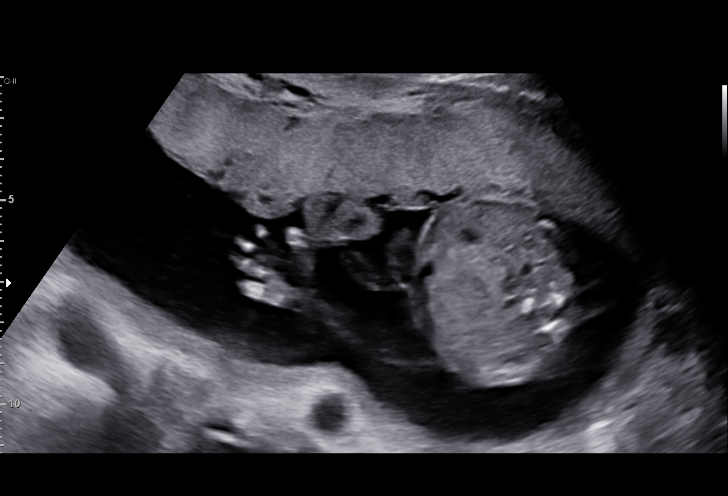

[13 of 28 positions shown; findings below may reference images not displayed]

Indications

 Obesity complicating pregnancy, second
 trimester
 Encounter for antenatal screening for
 malformations
 LR NIPS-female
 Horizon - negative
 19 weeks gestation of pregnancy
Fetal Evaluation

 Num Of Fetuses:         1
 Fetal Heart Rate(bpm):  150
 Cardiac Activity:       Observed
 Presentation:           Cephalic
 Placenta:               Anterior
 P. Cord Insertion:      Visualized, central

 Amniotic Fluid
 AFI FV:      Within normal limits

                             Largest Pocket(cm)

Biometry

 BPD:      45.3  mm     G. Age:  19w 5d         74  %    CI:        71.78   %    70 - 86
                                                         FL/HC:      18.2   %    16.1 -
 HC:      170.2  mm     G. Age:  19w 4d         66  %    HC/AC:      1.16        1.09 -
 AC:      146.8  mm     G. Age:  20w 0d         73  %    FL/BPD:     68.2   %
 FL:       30.9  mm     G. Age:  19w 4d         59  %    FL/AC:      21.0   %    20 - 24
 HUM:      28.9  mm     G. Age:  19w 3d         57  %
 CER:      19.7  mm     G. Age:  19w 1d         40  %
 NFT:       4.2  mm

 LV:        5.5  mm
 CM:        2.8  mm

 Est. FW:     312  gm    0 lb 11 oz      81  %
OB History

 Gravidity:    2         Term:   1
 Living:       1
Gestational Age

 LMP:           19w 1d        Date:  09/09/20                 EDD:   06/16/21
 U/S Today:     19w 5d                                        EDD:   06/12/21
 Best:          19w 1d     Det. By:  LMP  (09/09/20)          EDD:   06/16/21
Anatomy

 Cranium:               Appears normal         LVOT:                   Appears normal
 Cavum:                 Appears normal         Aortic Arch:            Not well visualized
 Ventricles:            Appears normal         Ductal Arch:            Not well visualized
 Choroid Plexus:        Appears normal         Diaphragm:              Appears normal
 Cerebellum:            Appears normal         Stomach:                Appears normal, left
                                                                       sided
 Posterior Fossa:       Appears normal         Abdomen:                Appears normal
 Nuchal Fold:           Appears normal         Abdominal Wall:         Appears nml (cord
                                                                       insert, abd wall)
 Face:                  Not well visualized    Cord Vessels:           Appears normal (3
                                                                       vessel cord)
 Lips:                  Appears normal         Kidneys:                Appear normal
 Palate:                Not well visualized    Bladder:                Appears normal
 Thoracic:              Appears normal         Spine:                  Appears normal
 Heart:                 Appears normal         Upper Extremities:      Appears normal
                        (4CH, axis, and
                        situs)
 RVOT:                  Not well visualized    Lower Extremities:      Appears normal

 Other:  Fetus appears to be female. Heels/feet and open hands/5th digits
         visualized. Technically difficult due to fetal position.
Cervix Uterus Adnexa

 Cervix
 Length:           3.19  cm.
 Normal appearance by transabdominal scan.

 Uterus
 No abnormality visualized.

 Right Ovary
 Within normal limits.

 Left Ovary
 Within normal limits.
 Cul De Sac
 No free fluid seen.

 Adnexa
 No abnormality visualized.
Comments

 This patient was seen for a detailed fetal anatomy scan due
 to maternal obesity.
 She denies any significant past medical history and denies
 any problems in her current pregnancy.
 She had a cell free DNA test earlier in her pregnancy which
 indicated a low risk for trisomy 21, 18, and 13. A female fetus
 is predicted.
 She was informed that the fetal growth and amniotic fluid
 level were appropriate for her gestational age.
 There were no obvious fetal anomalies noted on today's
 ultrasound exam.  However, the views of the fetal anatomy
 were limited today due to the fetal position.
 The patient was informed that anomalies may be missed due
 to technical limitations. If the fetus is in a suboptimal position
 or maternal habitus is increased, visualization of the fetus in
 the maternal uterus may be impaired.
 A follow-up exam was scheduled in 4 weeks to complete the
 views of the fetal anatomy.

## 2022-01-28 ENCOUNTER — Telehealth: Payer: Self-pay

## 2022-01-28 NOTE — Telephone Encounter (Signed)
Left message for pt to call office back regarding missed appt today and future appt for NOB. Pt was a no show.

## 2022-02-13 ENCOUNTER — Encounter: Payer: Medicaid Other | Admitting: Advanced Practice Midwife

## 2022-06-22 ENCOUNTER — Ambulatory Visit
Admission: EM | Admit: 2022-06-22 | Discharge: 2022-06-22 | Disposition: A | Discharge status: Home / Self Care | Payer: Medicaid Other

## 2022-06-22 NOTE — ED Triage Notes (Signed)
Patient to Urgent Care with complaints of abscess present to lower stomach. First appeared two weeks ago. Started hurting yesterday.   Draining today.

## 2022-06-22 NOTE — ED Notes (Signed)
Patient seen leaving the Urgent Care by registration staff. Reports she does not have time to wait so left before being evaluated by provider.

## 2023-03-30 DIAGNOSIS — F411 Generalized anxiety disorder: Secondary | ICD-10-CM | POA: Insufficient documentation

## 2023-05-13 NOTE — L&D Delivery Note (Signed)
    Jamie Escobar is a 23 y.o. [phone] s/p VD at [redacted]w[redacted]d. She was admitted for IOL s/t. NR NST   ROM: 3h 58m with clear fluid GBS Status: Positive/-- (07/10 1100) Maximum Maternal Temperature: 98.7  Labor Progress: Patient started on pitocin  and progressed to 3cm.  AROM performed and patient received epidural.  She progressed to complete and delivered as below with staff and family support.   Delivery Date/Time: Monday December 15, 2023 at 0309 Delivery: Called to room and patient was complete. Provider gowned and patient encouraged to push. After 3 pushes, head delivered in OA position.  After delivery of head, nuchal cord noted that shoulders and body was delivered through via somersault maneuver. Infant with good tone and spontaneous cry. Tactile stimulation given by provider and infant placed on mother's abdomen where nurse continued tactile stimulation. After 4 minute delay, the umbilical cord was clamped, cut by support person-Tamon, and blood collected by provider. Placenta delivered spontaneously via Keren and was noted to be intact with 3VC upon inspection.  Cord sample obtained and pitocin  initiated. Vaginal inspection revealed no lacerations.  Fundus firm, at the umbilicus, and bleeding small.  Mother hemodynamically stable and infant skin to skin. Reviewed immediate postpartum care including fundal checks, pain medication, and transfer to [facility] prior to provider exit.     Placenta: Intact, Disposal Complications:  -Labor/Delivery:GBS+, PD -Prenatal: Anxiety, Obesity Lacerations: None EBL: 503 Analgesia: Epidural  Postpartum Planning -Feeding: Breast -Circumcision: N/A -Anticipated BCM: Depo -PP Message Sent -Discharge Summary Shared  Infant: Female-Jaimee  APGARs 8, 9  3330g-7lbs 5.5oz, 19in  Harlene LITTIE Duncans, CNM  12/15/2023 5:51 AM  Delivery Report: Review the Delivery Report for details.

## 2023-05-19 ENCOUNTER — Other Ambulatory Visit (INDEPENDENT_AMBULATORY_CARE_PROVIDER_SITE_OTHER): Payer: Self-pay

## 2023-05-19 ENCOUNTER — Other Ambulatory Visit (HOSPITAL_COMMUNITY)
Admission: RE | Admit: 2023-05-19 | Discharge: 2023-05-19 | Disposition: A | Discharge status: Home / Self Care | Payer: Medicaid Other | Source: Ambulatory Visit | Attending: Obstetrics & Gynecology | Admitting: Obstetrics & Gynecology

## 2023-05-19 ENCOUNTER — Ambulatory Visit: Payer: Medicaid Other | Admitting: *Deleted

## 2023-05-19 VITALS — BP 122/82 | HR 76 | Wt 170.0 lb

## 2023-05-19 DIAGNOSIS — Z3481 Encounter for supervision of other normal pregnancy, first trimester: Secondary | ICD-10-CM

## 2023-05-19 DIAGNOSIS — Z3A10 10 weeks gestation of pregnancy: Secondary | ICD-10-CM | POA: Diagnosis not present

## 2023-05-19 DIAGNOSIS — O3680X0 Pregnancy with inconclusive fetal viability, not applicable or unspecified: Secondary | ICD-10-CM

## 2023-05-19 DIAGNOSIS — Z3480 Encounter for supervision of other normal pregnancy, unspecified trimester: Secondary | ICD-10-CM | POA: Insufficient documentation

## 2023-05-19 DIAGNOSIS — O0990 Supervision of high risk pregnancy, unspecified, unspecified trimester: Secondary | ICD-10-CM | POA: Insufficient documentation

## 2023-05-19 NOTE — Progress Notes (Signed)
 New OB Intake  I explained I am completing New OB Intake today. We discussed EDD of 12/11/2023, by Last Menstrual Period. Pt is G3P2002. I reviewed her allergies, medications and Medical/Surgical/OB history.    Patient Active Problem List   Diagnosis Date Noted   Supervision of high risk pregnancy, antepartum 05/19/2023   Obesity affecting pregnancy 11/21/2020    Concerns addressed today  Patient informed that the ultrasound is considered a limited obstetric ultrasound and is not intended to be a complete ultrasound exam.  Patient also informed that the ultrasound is not being completed with the intent of assessing for fetal or placental anomalies or any pelvic abnormalities. Explained that the purpose of today's ultrasound is to assess for viability.  Patient acknowledges the purpose of the exam and the limitations of the study.     Delivery Plans Plans to deliver at [facility]. Discussed the nature of our practice with multiple providers including residents and students. Due to the size of the practice, the delivering provider may not be the same as those providing prenatal care.   MyChart/Babyscripts MyChart access verified. I explained pt will have some visits in office and some virtually. Babyscripts app discussed and ordered.   Blood Pressure Cuff Blood pressure cuff discussed and given Discussed to be used for virtual visits and or if needed BP checks weekly.  Anatomy US  Explained first scheduled US  will be around 19 weeks.   Last Pap Diagnosis  Date Value Ref Range Status  11/21/2020   Final   - Negative for intraepithelial lesion or malignancy (NILM)    First visit review I reviewed new OB appt with patient. Explained pt will be seen by Dr Herchel at first visit. Discussed [place] genetic screening with patient Panorama collected today, had Horizon with previous pregnancy.. Routine prenatal labs collected today.SABRA Wanda Buckles, RN 05/19/2023  9:42 AM

## 2023-05-20 LAB — TSH RFX ON ABNORMAL TO FREE T4: TSH: 4.03 u[IU]/mL (ref 0.450–4.500)

## 2023-05-20 LAB — COMPREHENSIVE METABOLIC PANEL
ALT: 10 [IU]/L (ref 0–32)
AST: 12 [IU]/L (ref 0–40)
Albumin: 4.7 g/dL (ref 4.0–5.0)
Alkaline Phosphatase: 45 [IU]/L (ref 44–121)
BUN/Creatinine Ratio: 13 (ref 9–23)
BUN: 9 mg/dL (ref 6–20)
Bilirubin Total: 0.5 mg/dL (ref 0.0–1.2)
CO2: 21 mmol/L (ref 20–29)
Calcium: 9.6 mg/dL (ref 8.7–10.2)
Chloride: 101 mmol/L (ref 96–106)
Creatinine, Ser: 0.67 mg/dL (ref 0.57–1.00)
Globulin, Total: 2.1 g/dL (ref 1.5–4.5)
Glucose: 93 mg/dL (ref 70–99)
Potassium: 4.1 mmol/L (ref 3.5–5.2)
Sodium: 136 mmol/L (ref 134–144)
Total Protein: 6.8 g/dL (ref 6.0–8.5)
eGFR: 126 mL/min/{1.73_m2} (ref >59)

## 2023-05-20 LAB — CBC/D/PLT+RPR+RH+ABO+RUBIGG...
Antibody Screen: NEGATIVE
Basophils Absolute: 0 10*3/uL (ref 0.0–0.2)
Basos: 1 %
EOS (ABSOLUTE): 0.1 10*3/uL (ref 0.0–0.4)
Eos: 1 %
HCV Ab: NONREACTIVE
HIV Screen 4th Generation wRfx: NONREACTIVE
Hematocrit: 38.6 % (ref 34.0–46.6)
Hemoglobin: 12.8 g/dL (ref 11.1–15.9)
Hepatitis B Surface Ag: NEGATIVE
Immature Grans (Abs): 0 10*3/uL (ref 0.0–0.1)
Immature Granulocytes: 0 %
Lymphocytes Absolute: 1.4 10*3/uL (ref 0.7–3.1)
Lymphs: 20 %
MCH: 27.2 pg (ref 26.6–33.0)
MCHC: 33.2 g/dL (ref 31.5–35.7)
MCV: 82 fL (ref 79–97)
Monocytes Absolute: 0.5 10*3/uL (ref 0.1–0.9)
Monocytes: 7 %
Neutrophils Absolute: 5.1 10*3/uL (ref 1.4–7.0)
Neutrophils: 71 %
Platelets: 224 10*3/uL (ref 150–450)
RBC: 4.7 x10E6/uL (ref 3.77–5.28)
RDW: 12.6 % (ref 11.7–15.4)
RPR Ser Ql: NONREACTIVE
Rh Factor: POSITIVE
Rubella Antibodies, IGG: 1.55 {index} (ref >0.99)
WBC: 7.1 10*3/uL (ref 3.4–10.8)

## 2023-05-20 LAB — CERVICOVAGINAL ANCILLARY ONLY
Chlamydia: NEGATIVE
Comment: NEGATIVE
Comment: NORMAL
Neisseria Gonorrhea: NEGATIVE

## 2023-05-20 LAB — HEMOGLOBIN A1C
Est. average glucose Bld gHb Est-mCnc: 103 mg/dL
Hgb A1c MFr Bld: 5.2 % (ref 4.8–5.6)

## 2023-05-20 LAB — PROTEIN / CREATININE RATIO, URINE
Creatinine, Urine: 193.6 mg/dL
Protein, Ur: 17.4 mg/dL
Protein/Creat Ratio: 90 mg/g{creat} (ref 0–200)

## 2023-05-20 LAB — HCV INTERPRETATION

## 2023-05-21 LAB — URINE CULTURE, OB REFLEX

## 2023-05-21 LAB — CULTURE, OB URINE

## 2023-05-24 LAB — PANORAMA PRENATAL TEST FULL PANEL:PANORAMA TEST PLUS 5 ADDITIONAL MICRODELETIONS: FETAL FRACTION: 9.4

## 2023-06-02 ENCOUNTER — Ambulatory Visit: Payer: Medicaid Other | Admitting: Obstetrics & Gynecology

## 2023-06-02 ENCOUNTER — Encounter: Payer: Self-pay | Admitting: Obstetrics & Gynecology

## 2023-06-02 VITALS — BP 115/69 | HR 77 | Wt 169.0 lb

## 2023-06-02 DIAGNOSIS — Z3A12 12 weeks gestation of pregnancy: Secondary | ICD-10-CM | POA: Diagnosis not present

## 2023-06-02 DIAGNOSIS — O99210 Obesity complicating pregnancy, unspecified trimester: Secondary | ICD-10-CM

## 2023-06-02 DIAGNOSIS — Z1339 Encounter for screening examination for other mental health and behavioral disorders: Secondary | ICD-10-CM | POA: Diagnosis not present

## 2023-06-02 DIAGNOSIS — O0991 Supervision of high risk pregnancy, unspecified, first trimester: Secondary | ICD-10-CM | POA: Diagnosis not present

## 2023-06-02 DIAGNOSIS — O99211 Obesity complicating pregnancy, first trimester: Secondary | ICD-10-CM | POA: Diagnosis not present

## 2023-06-02 DIAGNOSIS — O0990 Supervision of high risk pregnancy, unspecified, unspecified trimester: Secondary | ICD-10-CM

## 2023-06-02 MED ORDER — ASPIRIN 81 MG PO TBEC: 81.0000 mg | DELAYED_RELEASE_TABLET | Freq: Every day | ORAL | 2 refills | Status: DC

## 2023-06-02 NOTE — Progress Notes (Signed)
New OB     History:   Jamie Escobar is a 23 y.o. G3P2002 at [redacted]w[redacted]d by LMP, early ultrasound being seen today for her first obstetrical visit.  Her obstetrical history is significant for  two term SVDs . Patient does intend to breast feed. Pregnancy history fully reviewed.  Patient reports no complaints.     HISTORY: OB History  Gravida Para Term Preterm AB Living  3 2 2  0 0 2  SAB IAB Ectopic Multiple Live Births  0 0 0 0 2    # Outcome Date GA Lbr Len/2nd Weight Sex Type Anes PTL Lv  3 Current           2 Term 06/09/21 [redacted]w[redacted]d 15:33 / 00:12 7 lb 4.2 oz (3.295 kg) F Vag-Spont EPI  LIV     Name: IVAN, BENNE     Apgar1: 9  Apgar5: 9  1 Term 11/03/15 [redacted]w[redacted]d 14:50 / 03:19 5 lb 12.4 oz (2.62 kg) M Vag-Spont EPI  LIV     Birth Comments: none     Name: Edmonia Lynch     Apgar1: 9  Apgar5: 9    Last pap smear was done 11/21/2020 and was normal.  Wants next pap postpartum.  Past Medical History:  Diagnosis Date   Chronic rhinitis 02/01/2020   Eczema    Hydronephrosis    Menorrhagia with irregular cycle 10/23/2017   Skin lesion 10/23/2017   Urticaria    Past Surgical History:  Procedure Laterality Date   FOOT SURGERY Right    Family History  Problem Relation Age of Onset   Cancer Father        brain    Diabetes Maternal Grandmother    Heart disease Maternal Grandmother    Allergic rhinitis Neg Hx    Angioedema Neg Hx    Asthma Neg Hx    Atopy Neg Hx    Eczema Neg Hx    Immunodeficiency Neg Hx    Urticaria Neg Hx    Social History   Tobacco Use   Smoking status: Former    Types: E-cigarettes   Smokeless tobacco: Never  Vaping Use   Vaping status: Former   Substances: Nicotine  Substance Use Topics   Alcohol use: No   Drug use: Not Currently    Types: Marijuana   No Known Allergies Current Outpatient Medications on File Prior to Visit  Medication Sig Dispense Refill   Prenatal Vit-Fe Fumarate-FA (PREPLUS) 27-1 MG TABS Take 1 tablet by mouth  daily. 30 tablet 13   sertraline (ZOLOFT) 50 MG tablet Take 50 mg by mouth daily.     No current facility-administered medications on file prior to visit.    Review of Systems Pertinent items noted in HPI and remainder of comprehensive ROS otherwise negative.  Physical Exam:   Vitals:   06/02/23 0959  BP: 115/69  Pulse: 77  Weight: 169 lb (76.7 kg)   Fetal Heart Rate (bpm): 158   General: well-developed, well-nourished female in no acute distress  Breasts:  deferred  Skin: normal coloration and turgor, no rashes  Neurologic: oriented, normal, negative, normal mood  Extremities: normal strength, tone, and muscle mass, ROM of all joints is normal  HEENT PERRLA, extraocular movement intact and sclera clear, anicteric  Neck supple and no masses  Cardiovascular: regular rate and rhythm  Respiratory:  no respiratory distress, normal breath sounds  Abdomen: soft, non-tender; bowel sounds normal; no masses,  no organomegaly  Pelvic: deferred  Results for orders placed or performed in visit on 05/19/23 (from the past 4 weeks)  Cervicovaginal ancillary only   Collection Time: 05/19/23  9:33 AM  Result Value Ref Range   Neisseria Gonorrhea Negative    Chlamydia Negative    Comment Normal Reference Ranger Chlamydia - Negative    Comment      Normal Reference Range Neisseria Gonorrhea - Negative  PANORAMA PRENATAL TEST   Collection Time: 05/19/23  9:34 AM  Result Value Ref Range   REPORT SUMMARY LOW RISK    REPORT NOTE See Notes    GENDER OF FETUS Female    FETAL FRACTION 9.4%    TRISOMY 21 RESULT TEXT Low Risk    TRISOMY 21 AGE-BASED RISK TEXT 1/983 (0.1%)    TRISOMY 21 RISK SCORE TEXT <1/10,000 (<0.01%)    TRISOMY 18 RESULT TEXT Low Risk    TRISOMY 18 AGE-BASED RISK TEXT 1/1,993 (0.05%)    TRISOMY 18 RISK SCORE TEXT <1/10,000 (<0.01%)    TRISOMY 13 RESULT TEXT Low Risk    TRISOMY 13 AGE-BASED RISK TEXT 1/6,347 (0.02%)    TRISOMY 13 RISK SCORE TEXT <1/10,000 (<0.01%)     MONOSOMY X RESULT TEXT Low Risk    MONOSOMY X AGE-BASED RISK TEXT 1/255 (0.39%)    MONOSOMY X RISK SCORE TEXT <1/10,000 (<0.01%)    TRIPLOIDY RESULT TEXT Low Risk    22Q11.2 DELETION SYNDROME RESULT TEXT Low Risk    22Q11.2 DELETION SYNDROME POPULATION-BASED RISK TEXT 1/2,000    22Q11.2 DELETION SYNDROME RISK SCORE TEXT 1/12,000    FOOTNOTES See Notes   Culture, OB Urine   Collection Time: 05/19/23  9:50 AM   Specimen: Vein; Serum   UR  Result Value Ref Range   Urine Culture, OB Final report   Urine Culture, OB Reflex   Collection Time: 05/19/23  9:50 AM  Result Value Ref Range   Organism ID, Bacteria Comment   CBC/D/Plt+RPR+Rh+ABO+RubIgG...   Collection Time: 05/19/23  9:51 AM  Result Value Ref Range   Hepatitis B Surface Ag Negative Negative   HCV Ab Non Reactive Non Reactive   RPR Ser Ql Non Reactive Non Reactive   Rubella Antibodies, IGG 1.55 Immune >0.99 index   ABO Grouping O    Rh Factor Positive    Antibody Screen Negative Negative   HIV Screen 4th Generation wRfx Non Reactive Non Reactive   WBC 7.1 3.4 - 10.8 x10E3/uL   RBC 4.70 3.77 - 5.28 x10E6/uL   Hemoglobin 12.8 11.1 - 15.9 g/dL   Hematocrit 96.2 95.2 - 46.6 %   MCV 82 79 - 97 fL   MCH 27.2 26.6 - 33.0 pg   MCHC 33.2 31.5 - 35.7 g/dL   RDW 84.1 32.4 - 40.1 %   Platelets 224 150 - 450 x10E3/uL   Neutrophils 71 Not Estab. %   Lymphs 20 Not Estab. %   Monocytes 7 Not Estab. %   Eos 1 Not Estab. %   Basos 1 Not Estab. %   Neutrophils Absolute 5.1 1.4 - 7.0 x10E3/uL   Lymphocytes Absolute 1.4 0.7 - 3.1 x10E3/uL   Monocytes Absolute 0.5 0.1 - 0.9 x10E3/uL   EOS (ABSOLUTE) 0.1 0.0 - 0.4 x10E3/uL   Basophils Absolute 0.0 0.0 - 0.2 x10E3/uL   Immature Granulocytes 0 Not Estab. %   Immature Grans (Abs) 0.0 0.0 - 0.1 x10E3/uL  Comprehensive metabolic panel   Collection Time: 05/19/23  9:51 AM  Result Value Ref Range   Glucose 93  70 - 99 mg/dL   BUN 9 6 - 20 mg/dL   Creatinine, Ser 2.13 0.57 - 1.00 mg/dL    eGFR 086 >57 QI/ONG/2.95   BUN/Creatinine Ratio 13 9 - 23   Sodium 136 134 - 144 mmol/L   Potassium 4.1 3.5 - 5.2 mmol/L   Chloride 101 96 - 106 mmol/L   CO2 21 20 - 29 mmol/L   Calcium 9.6 8.7 - 10.2 mg/dL   Total Protein 6.8 6.0 - 8.5 g/dL   Albumin 4.7 4.0 - 5.0 g/dL   Globulin, Total 2.1 1.5 - 4.5 g/dL   Bilirubin Total 0.5 0.0 - 1.2 mg/dL   Alkaline Phosphatase 45 44 - 121 IU/L   AST 12 0 - 40 IU/L   ALT 10 0 - 32 IU/L  Hemoglobin A1c   Collection Time: 05/19/23  9:51 AM  Result Value Ref Range   Hgb A1c MFr Bld 5.2 4.8 - 5.6 %   Est. average glucose Bld gHb Est-mCnc 103 mg/dL  Protein / creatinine ratio, urine   Collection Time: 05/19/23  9:51 AM  Result Value Ref Range   Creatinine, Urine 193.6 Not Estab. mg/dL   Protein, Ur 28.4 Not Estab. mg/dL   Protein/Creat Ratio 90 0 - 200 mg/g creat  TSH Rfx on Abnormal to Free T4   Collection Time: 05/19/23  9:51 AM  Result Value Ref Range   TSH 4.030 0.450 - 4.500 uIU/mL  Interpretation:   Collection Time: 05/19/23  9:51 AM  Result Value Ref Range   HCV Interp 1: Comment      Assessment:    Pregnancy: [id number] Patient Active Problem List   Diagnosis Date Noted   Supervision of high risk pregnancy, antepartum 05/19/2023   Obesity affecting pregnancy 11/21/2020     Plan:    1. Obesity affecting pregnancy Normal surveillance labs.  Aspirin prescribed for preeclampsia prophylaxis. - [place] MFM OB DETAIL +14 WK; Future - aspirin EC 81 MG tablet; Take 1 tablet (81 mg total) by mouth at bedtime. Start taking when you are [redacted] weeks pregnant for rest of pregnancy for prevention of preeclampsia  Dispense: 300 tablet; Refill: 2  2. [redacted] weeks gestation of pregnancy 3. Supervision of high risk pregnancy, antepartum (Primary) Discussed option of optimized schedule, she desires this. Advised to check BP and weight weekly, enter into Babyscripts app.  Initial labs reviewed, no concerns. Anatomy scan ordered.  - [place] MFM OB DETAIL +14 WK;  Future - Babyscripts Schedule Optimization Continue prenatal vitamins. Problem list reviewed and updated. Genetic Screening  Panorama and Horizon:  normal . Ultrasound discussed; fetal anatomic survey: scheduled. Anticipatory guidance about prenatal visits given including labs, ultrasounds, and testing. Weight gain recommendations per IOM guidelines reviewed: underweight/BMI 18.5 or less > 28 - 40 lbs; normal weight/BMI 18.5 - 24.9 > 25 - 35 lbs; overweight/BMI 25 - 29.9 > 15 - 25 lbs; obese/BMI  30 or more > 11 - 20 lbs. Discussed usage of the Babyscripts app for more information about pregnancy, and to track blood pressures. Also discussed usage of virtual visits as additional source of managing and completing prenatal visits.  Patient was encouraged to use MyChart to review results, send requests, and have questions addressed.   The nature of Center for [facility] Healthcare/Faculty Practice with multiple MDs and Advanced Practice Providers was explained to patient; also emphasized that residents, students are part of our team. Routine obstetric precautions reviewed. Encouraged to seek out care at our office or emergency room [facility]  MAU preferred) for urgent and/or emergent concerns. Return in about 8 weeks (around 07/28/2023) for OFFICE OB VISIT (MD only) - 20 weeks Babyscripts Optimized.     Jaynie Collins, MD, FACOG Obstetrician & Gynecologist, [facility] for [employer], [facility]

## 2023-06-30 ENCOUNTER — Encounter: Payer: Medicaid Other | Admitting: Obstetrics and Gynecology

## 2023-06-30 ENCOUNTER — Encounter: Payer: Self-pay | Admitting: Obstetrics & Gynecology

## 2023-07-01 ENCOUNTER — Other Ambulatory Visit: Payer: Self-pay | Admitting: *Deleted

## 2023-07-01 MED ORDER — CYCLOBENZAPRINE HCL 10 MG PO TABS: 10.0000 mg | ORAL_TABLET | Freq: Three times a day (TID) | ORAL | 2 refills | Status: DC | PRN

## 2023-07-20 ENCOUNTER — Other Ambulatory Visit: Payer: Self-pay

## 2023-07-20 ENCOUNTER — Ambulatory Visit: Payer: Medicaid Other | Attending: Maternal & Fetal Medicine

## 2023-07-20 ENCOUNTER — Ambulatory Visit: Admitting: Maternal & Fetal Medicine

## 2023-07-20 DIAGNOSIS — Z363 Encounter for antenatal screening for malformations: Secondary | ICD-10-CM | POA: Diagnosis not present

## 2023-07-20 DIAGNOSIS — O99212 Obesity complicating pregnancy, second trimester: Secondary | ICD-10-CM | POA: Insufficient documentation

## 2023-07-20 DIAGNOSIS — Z3A19 19 weeks gestation of pregnancy: Secondary | ICD-10-CM

## 2023-07-20 DIAGNOSIS — E669 Obesity, unspecified: Secondary | ICD-10-CM

## 2023-07-20 DIAGNOSIS — O99210 Obesity complicating pregnancy, unspecified trimester: Secondary | ICD-10-CM

## 2023-07-20 DIAGNOSIS — O0990 Supervision of high risk pregnancy, unspecified, unspecified trimester: Secondary | ICD-10-CM

## 2023-07-20 NOTE — Progress Notes (Signed)
 Patient information  Patient Name: Jamie Escobar  Patient MRN:   [id number]  Referring practice: MFM Referring Provider: [facility]  MFM CONSULT  Jamie Escobar is a 23 y.o. G4W1027 at [redacted]w[redacted]d here for ultrasound and consultation. Patient Active Problem List   Diagnosis Date Noted   Supervision of high risk pregnancy, antepartum 05/19/2023   Obesity affecting pregnancy 11/21/2020    Camylle A Zukas is doing well today with no acute concerns.  She has had two prior uncomplicated pregnancies.  She denies a history of gestational hypertension or gestational use.  She reports no history of preterm contractions.  She is accompanied by the father of the baby and she and he both deny any history of genetic or structural abnormalities/birth defects.  She had low risk aneuploidy screening.  Since her BMI is less than 35 we do not recommend future serial growth ultrasounds unless the fundal height is difficult to measure. I discussed that her ultrasound appears normal but that there are certain limitations of ultrasound in pregnancy.  Currently she has no indication for future ultrasounds but if indications arise that she can be referred back to MFM.  Sonographic findings Single intrauterine pregnancy at 19w 3d. Fetal cardiac activity:  Observed and appears normal. Presentation: Cephalic. The anatomic structures that were well seen appear normal without evidence of soft markers. The anatomic survey is complete.  Fetal biometry shows the estimated fetal weight at the 42 percentile. Amniotic fluid: Within normal limits.  MVP: 5.06 cm. Placenta: Anterior. Adnexa: No abnormality visualized. Cervical length: 5 cm.  There are limitations of prenatal ultrasound such as the inability to detect certain abnormalities due to poor visualization. Various factors such as fetal position, gestational age and maternal body habitus may increase the difficulty in visualizing the  fetal anatomy.    Recommendations -EDD should be 12/11/2023 based on  LMP  (03/06/23). -No further ultrasounds are recommended at this time based on the current indications. If future indications arise (e.g. size/date discrepancy on fundal height, gestational diabetes or hypertension) and an ultrasound is to be desired at our MFM office, please send a referral.   Review of Systems: A review of systems was performed and was negative except per HPI   Vitals and Physical Exam    06/02/2023    9:59 AM 05/19/2023    9:23 AM 06/22/2022   12:21 PM  Vitals with BMI  Weight 169 lbs 170 lbs   Systolic 115 122   Diastolic 69 82   Pulse 77 76 97    Sitting comfortably on the sonogram table Nonlabored breathing Normal rate and rhythm Abdomen is nontender  Past pregnancies OB History  Gravida Para Term Preterm AB Living  3 2 2   2   SAB IAB Ectopic Multiple Live Births     0 2    # Outcome Date GA Lbr Len/2nd Weight Sex Type Anes PTL Lv  3 Current           2 Term 06/09/21 [redacted]w[redacted]d 15:33 / 00:12 3295 g F Vag-Spont EPI  LIV  1 Term 11/03/15 105w1d 14:50 / 03:19 2620 g M Vag-Spont EPI  LIV     Birth Comments: none     I spent 15 minutes reviewing the patients chart, including labs and images as well as counseling the patient about her medical conditions. Greater than 50% of the time was spent in direct face-to-face patient counseling.  Braxton Feathers, DO Maternal fetal medicine, [facility]   07/20/2023  11:11 AM

## 2023-07-28 ENCOUNTER — Ambulatory Visit (INDEPENDENT_AMBULATORY_CARE_PROVIDER_SITE_OTHER): Payer: Medicaid Other | Admitting: Obstetrics & Gynecology

## 2023-07-28 VITALS — BP 112/75 | HR 77 | Wt 168.4 lb

## 2023-07-28 DIAGNOSIS — Z3A20 20 weeks gestation of pregnancy: Secondary | ICD-10-CM

## 2023-07-28 DIAGNOSIS — O0990 Supervision of high risk pregnancy, unspecified, unspecified trimester: Secondary | ICD-10-CM

## 2023-07-28 NOTE — Patient Instructions (Signed)
 Oral Glucose Tolerance Test During Pregnancy Why am I having this test? The oral glucose tolerance test (GTT) is done to check how your body processes blood sugar (glucose). This is one of several tests used to diagnose diabetes that develops during pregnancy (gestational diabetes mellitus). Gestational diabetes is a short-term form of diabetes that some women develop while they are pregnant. It usually occurs during the second or third trimester of pregnancy and goes away after delivery. Testing, or screening, for gestational diabetes usually occurs around 69 of pregnancy. This test may also be needed earlier if: You have a history of gestational diabetes. There is a history of giving birth to very large babies or of losing pregnancies (having stillbirths). You have signs and symptoms of diabetes, such as: Changes in your eyesight. Tingling or numbness in your hands or feet. Changes in hunger, thirst, and urination, and these are not explained by your pregnancy. What is being tested? This test measures the amount of glucose in your blood at different times during a period of 2 hours. This shows how well your body can process glucose.  You will have three separate blood draws. What kind of sample is taken?  Blood samples are required for this test. They are usually collected by inserting a needle into a blood vessel. How do I prepare for this test? For 3 days before your test, eat normally. Have plenty of carbohydrate-rich foods. You will be asked not to eat or drink anything other than water (to fast) starting 8-10 hours before the test. Tell a health care provider about: All medicines you are taking, including vitamins, herbs, eye drops, creams, and over-the-counter medicines. Any blood disorders you have. Any surgeries you have had. Any medical conditions you have. What happens during the test? First, your blood glucose will be measured. This is referred to as your fasting blood glucose  because you fasted before the test. Then, you will drink a glucose solution that contains a certain amount of glucose. Your blood glucose will be measured again 1 and 2 hours after you drink the solution. This test takes about 2 hours to complete. You will need to stay at the testing location during this time. During the testing period: Do not eat or drink anything other than the glucose solution. Do not exercise. Do not use any products that contain nicotine or tobacco, such as cigarettes, e-cigarettes, and chewing tobacco. These can affect your test results. If you need help quitting, ask your health care provider. The testing procedure may vary among health care providers and hospitals. How are the results reported? Your results will be reported as milligrams of glucose per deciliter of blood (mg/dL) or millimoles per liter (mmol/L). There is more than one source for screening and diagnosis reference values used to diagnose gestational diabetes. Your health care provider will compare your results to normal values that were established after testing a large group of people (reference values). Reference values may vary among labs and hospitals. For this test, reference values are: Fasting: 92 mg/dL 1 hour: 161 mg/dL  2 hour: 096 mg/dL   What do the results mean? Results below the reference values are considered normal. If one or more of your blood glucose levels are at or above the reference values, you will be diagnosed with gestational diabetes.  Talk with your health care provider about what your results mean. Questions to ask your health care provider Ask your health care provider, or the department that is doing the test: When  will my results be ready? How will I get my results? What are my treatment options? What other tests do I need? What are my next steps? Summary The oral glucose tolerance test (GTT) is one of several tests used to diagnose diabetes that develops during pregnancy  (gestational diabetes mellitus). Gestational diabetes is a short-term form of diabetes that some women develop while they are pregnant. You may also have this test if you have any symptoms or risk factors for this type of diabetes. Talk with your health care provider about what your results mean. This information is not intended to replace advice given to you by your health care provider. Make sure you discuss any questions you have with your health care provider.  TDaP Vaccine Pregnancy Get the Whooping Cough Vaccine While You Are Pregnant (CDC)  It is important for women to get the whooping cough vaccine in the third trimester of each pregnancy. Vaccines are the best way to prevent this disease. There are 2 different whooping cough vaccines. Both vaccines combine protection against whooping cough, tetanus and diphtheria, but they are for different age groups: Tdap: for everyone 11 years or older, including pregnant women  DTaP: for children 2 months through 45 years of age  You need the whooping cough vaccine during each of your pregnancies The recommended time to get the shot is during your 27th through 36th week of pregnancy, preferably during the earlier part of this time period. The Centers for Disease Control and Prevention (CDC) recommends that pregnant women receive the whooping cough vaccine for adolescents and adults (called Tdap vaccine) during the third trimester of each pregnancy. The recommended time to get the shot is during your 27th through 36th week of pregnancy, preferably during the earlier part of this time period. This replaces the original recommendation that pregnant women get the vaccine only if they had not previously received it. The [employer]nec[employer] and the [employer] support this recommendation.  You should get the whooping cough vaccine while pregnant to pass protection to your baby frame support disabled  and/or not supported in this browser  Learn why Jamie Escobar decided to get the whooping cough vaccine in her 3rd trimester of pregnancy and how her baby girl was born with some protection against the disease. Also available on YouTube. After receiving the whooping cough vaccine, your body will create protective antibodies (proteins produced by the body to fight off diseases) and pass some of them to your baby before birth. These antibodies provide your baby some short-term protection against whooping cough in early life. These antibodies can also protect your baby from some of the more serious complications that come along with whooping cough. Your protective antibodies are at their highest about 2 weeks after getting the vaccine, but it takes time to pass them to your baby. So the preferred time to get the whooping cough vaccine is early in your third trimester. The amount of whooping cough antibodies in your body decreases over time. That is why CDC recommends you get a whooping cough vaccine during each pregnancy. Doing so allows each of your babies to get the greatest number of protective antibodies from you. This means each of your babies will get the best protection possible against this disease.  Getting the whooping cough vaccine while pregnant is better than getting the vaccine after you give birth Whooping cough vaccination during pregnancy is ideal so your baby will have short-term protection as soon as he  is born. This early protection is important because your baby will not start getting his whooping cough vaccines until he is 2 months old. These first few months of life are when your baby is at greatest risk for catching whooping cough. This is also when he's at greatest risk for having severe, potentially life-threating complications from the infection. To avoid that gap in protection, it is best to get a whooping cough vaccine during pregnancy. You will then pass protection to your baby before he  is born. To continue protecting your baby, he should get whooping cough vaccines starting at 2 months old. You may never have gotten the Tdap vaccine before and did not get it during this pregnancy. If so, you should make sure to get the vaccine immediately after you give birth, before leaving the hospital or birthing center. It will take about 2 weeks before your body develops protection (antibodies) in response to the vaccine. Once you have protection from the vaccine, you are less likely to give whooping cough to your newborn while caring for him. But remember, your baby will still be at risk for catching whooping cough from others. A recent study looked to see how effective Tdap was at preventing whooping cough in babies whose mothers got the vaccine while pregnant or in the hospital after giving birth. The study found that getting Tdap between 27 through 36 weeks of pregnancy is 85% more effective at preventing whooping cough in babies younger than 2 months old. Blood tests cannot tell if you need a whooping cough vaccine There are no blood tests that can tell you if you have enough antibodies in your body to protect yourself or your baby against whooping cough. Even if you have been sick with whooping cough in the past or previously received the vaccine, you still should get the vaccine during each pregnancy. Breastfeeding may pass some protective antibodies onto your baby By breastfeeding, you may pass some antibodies you have made in response to the vaccine to your baby. When you get a whooping cough vaccine during your pregnancy, you will have antibodies in your breast milk that you can share with your baby as soon as your milk comes in. However, your baby will not get protective antibodies immediately if you wait to get the whooping cough vaccine until after delivering your baby. This is because it takes about 2 weeks for your body to create antibodies. Learn more about the health benefits of  breastfeeding.

## 2023-07-28 NOTE — Progress Notes (Signed)
 PRENATAL VISIT NOTE  Subjective:  Jamie Escobar is a 23 y.o. G3P2002 at [redacted]w[redacted]d being seen today for ongoing prenatal care.  She is currently monitored for the following issues for this high-risk pregnancy and has Obesity affecting pregnancy and Supervision of high risk pregnancy, antepartum on their problem list.  Patient reports no complaints.  Contractions: Not present. Vag. Bleeding: None.  Movement: Present. Denies leaking of fluid.   The following portions of the patient's history were reviewed and updated as appropriate: allergies, current medications, past family history, past medical history, past social history, past surgical history and problem list.   Objective:   Vitals:   07/28/23 1044  BP: 112/75  Pulse: 77  Weight: 168 lb 6.4 oz (76.4 kg)    Fetal Status: Fetal Heart Rate (bpm): 163.   Movement: Present     General:  Alert, oriented and cooperative. Patient is in no acute distress.  Skin: Skin is warm and dry. No rash noted.   Cardiovascular: Normal heart rate noted  Respiratory: Normal respiratory effort, no problems with respiration noted  Abdomen: Soft, gravid, appropriate for gestational age.  Pain/Pressure: Absent     Pelvic: Cervical exam deferred        Extremities: Normal range of motion.  Edema: None  Mental Status: Normal mood and affect. Normal behavior. Normal judgment and thought content.   [place] MFM OB DETAIL +14 WK Result Date: 07/20/2023 ----------------------------------------------------------------------  OBSTETRICS REPORT                       (Signed Final 07/20/2023 03:22 pm) ---------------------------------------------------------------------- Patient Info  ID #:       [id number]                          D.O.B.:  [birth year] (23 yrs)(F)  Name:       Jamie Escobar              Visit Date: 07/20/2023 10:13 am ---------------------------------------------------------------------- Performed By  Attending:        Braxton Feathers DO       Ref.  Address:     [address]                                                             [address]  Performed By:     Eden Lathe BS      [facility] at                                                             [facility]  Referred By:      [facility] ---------------------------------------------------------------------- Orders  #  Description                           Code        Ordered By  1  [place] MFM OB DETAIL +14 WK               [id number]    Jaynie Collins ----------------------------------------------------------------------  #  Order #                     Accession #                Episode #  1  [id number]                   [id number]                 [id number] ---------------------------------------------------------------------- Indications  [redacted] weeks gestation of pregnancy                Z3A.19  Obesity complicating pregnancy, second         O99.212  trimester (pregravid BMI 32)  Encounter for antenatal screening for          Z36.3  malformations  Low risk NIPS, neg Horizon ---------------------------------------------------------------------- Fetal Evaluation  Num Of Fetuses:         1  Fetal Heart Rate(bpm):  151  Cardiac Activity:       Observed  Presentation:           Cephalic  Placenta:               Anterior  P. Cord Insertion:      Visualized  Amniotic Fluid  AFI FV:      Within normal limits                              Largest Pocket(cm)                              5.06 ---------------------------------------------------------------------- Biometry  BPD:      44.4  mm     G. Age:  19w 3d         50  %    CI:         70.5   %    70 - 86                                                          FL/HC:      18.0   %    16.1 - 18.3  HC:    168.58   mm     G. Age:  19w 4d         46  %    HC/AC:      1.21        1.09 - 1.39  AC:    139.39   mm     G. Age:  19w 2d         42  %    FL/BPD:     68.3    %  FL:      30.31  mm     G. Age:  19w 3d         41  %    FL/AC:      21.7   %    20 - 24  CER:      19.5  mm     G. Age:  19w 0d         36  %  NFT:       2.3  mm  LV:        5.6  mm  CM:        5.6  mm  Est. FW:     289  gm    0 lb 10 oz      42  % ---------------------------------------------------------------------- OB History  Gravidity:    3         Term:   2        Prem:   0        SAB:   0  TOP:          0       Ectopic:  0        Living: 2 ---------------------------------------------------------------------- Gestational Age  LMP:           19w 3d        Date:  03/06/23                 EDD:   12/11/23  U/S Today:     19w 3d                                        EDD:   12/11/23  Best:          19w 3d     Det. By:  LMP  (03/06/23)          EDD:   12/11/23 ---------------------------------------------------------------------- Targeted Anatomy  Central Nervous System  Calvarium/Cranial V.:  Appears normal         Cereb./Vermis:          Appears normal  Cavum:                 Appears normal         Cisterna Magna:         Appears normal  Lateral Ventricles:    Appears normal         Midline Falx:           Appears normal  Choroid Plexus:        Appears normal  Spine  Cervical:              Appears normal         Sacral:                 Appears normal  Thoracic:              Appears normal         Shape/Curvature:        Appears normal  Lumbar:                Appears normal  Head/Neck  Lips:                  Appears normal         Profile:                Appears normal  Neck:  Appears normal         Orbits/Eyes:            Appears normal  Nuchal Fold:           Appears normal         Mandible:               Appears normal  Nasal Bone:            Present                Maxilla:                Appears normal  Thorax  4 Chamber View:        Appears normal         SVC:                    Appears normal  Cardiac Activity:      Observed               Interventr. Septum:     Appears normal  Cardiac Rhythm:         Normal                 Cardiac Axis:           Normal  Cardiac Situs:         Appears normal         Diaphragm:              Appears normal  Rt Outflow Tract:      Appears normal         3 Vessel View:          Appears normal  Lt Outflow Tract:      Appears normal         3 V Trachea View:       Appears normal  Aortic Arch:           Appears normal         IVC:                    Appears normal  Ductal Arch:           Appears normal         Crossing:               Appears normal  Abdomen  Ventral Wall:          Appears normal         Lt Kidney:              Appears normal  Cord Insertion:        Appears normal         Rt Kidney:              Appears normal  Situs:                 Appears normal         Bladder:                Appears normal  Stomach:               Appears normal  Extremities  Lt Humerus:            Appears normal         Lt Femur:               Appears  normal  Rt Humerus:            Appears normal         Rt Femur:               Appears normal  Lt Forearm:            Appears normal         Lt Lower Leg:           Appears normal  Rt Forearm:            Appears normal         Rt Lower Leg:           Appears normal  Lt Hand:               Open hand nml          Lt Foot:                Nml heel/foot  Rt Hand:               Open hand nml          Rt Foot:                Nml heel/foot  Other  Umbilical Cord:        Normal 3-vessel        Genitalia:              Female-nml  Comment:     Fetal anatomic survey complete. ---------------------------------------------------------------------- Cervix Uterus Adnexa  Cervix  Length:              5  cm.  Normal appearance by transabdominal scan  Uterus  No abnormality visualized.  Right Ovary  Within normal limits.  Left Ovary  Within normal limits.  Cul De Sac  No free fluid seen.  Adnexa  No abnormality visualized ---------------------------------------------------------------------- Comments  MFM Consult Note  Berline A Oppedisano is doing well today with no  acute  concerns.  She has had two prior uncomplicated  pregnancies.  She denies a history of gestational  hypertension or gestational diabetes.  She reports no history  of preterm contractions.  She is accompanied by the father of  the baby and she and he both deny any history of genetic or  structural abnormalities/birth defects.  She had low risk  aneuploidy screening.  Since her BMI is less than 35 we do not recommend future  serial growth ultrasounds unless the fundal height is difficult  to measure. I discussed that her ultrasound appears normal  but that there are certain limitations of ultrasound in  pregnancy.  Currently she has no indication for future  ultrasounds but if indications arise that she can be referred  back to MFM.  Sonographic findings  Single intrauterine pregnancy at 19w 3d.  Fetal cardiac activity:  Observed and appears normal.  Presentation: Cephalic.  The anatomic structures that were well seen appear normal  without evidence of soft markers. The anatomic survey is  complete.  Fetal biometry shows the estimated fetal weight at the 42  percentile.  Amniotic fluid: Within normal limits.  MVP: 5.06 cm.  Placenta: Anterior.  Adnexa: No abnormality visualized.  Cervical length: 5 cm.  There are limitations of prenatal ultrasound such as the  inability to detect certain abnormalities due to poor  visualization. Various factors such as fetal position,  gestational age and maternal body habitus may increase the  difficulty in  visualizing the fetal anatomy.  Recommendations  -EDD should be 12/11/2023 based on  LMP  (03/06/23).  -No further ultrasounds are recommended at this time based  on the current indications. If future indications arise (e.g.  size/date discrepancy on fundal height, gestational diabetes  or hypertension) and an ultrasound is to be desired at our  MFM office, please send a referral. ----------------------------------------------------------------------                  Braxton Feathers, DO Electronically Signed Final Report   07/20/2023 03:22 pm ----------------------------------------------------------------------    Assessment and Plan:  Pregnancy: G3P2002 at [redacted]w[redacted]d 1. [redacted] weeks gestation of pregnancy 2. Supervision of high risk pregnancy, antepartum (Primary) Normal anatomy scan, no concerns. On optimized schedule, checking BP weekly and putting the values in the [employer] app.  Preterm labor symptoms and general obstetric precautions including but not limited to vaginal bleeding, contractions, leaking of fluid and fetal movement were reviewed in detail with the patient. Please refer to After Visit Summary for other counseling recommendations.   Return in about 8 weeks (around 09/22/2023) for 2 hr GTT, 3rd trimester labs, TDap, OFFICE OB VISIT (MD only).  No future appointments.  Jaynie Collins, MD

## 2023-09-15 ENCOUNTER — Inpatient Hospital Stay (HOSPITAL_COMMUNITY)
Admission: AD | Admit: 2023-09-15 | Discharge: 2023-09-15 | Disposition: A | Discharge status: Home / Self Care | Attending: Obstetrics & Gynecology | Admitting: Obstetrics & Gynecology

## 2023-09-15 ENCOUNTER — Telehealth: Payer: Self-pay

## 2023-09-15 ENCOUNTER — Encounter (HOSPITAL_COMMUNITY): Payer: Self-pay | Admitting: Obstetrics & Gynecology

## 2023-09-15 ENCOUNTER — Inpatient Hospital Stay (HOSPITAL_COMMUNITY)

## 2023-09-15 DIAGNOSIS — Z3A27 27 weeks gestation of pregnancy: Secondary | ICD-10-CM

## 2023-09-15 DIAGNOSIS — N1330 Unspecified hydronephrosis: Secondary | ICD-10-CM | POA: Insufficient documentation

## 2023-09-15 DIAGNOSIS — O99212 Obesity complicating pregnancy, second trimester: Secondary | ICD-10-CM | POA: Diagnosis not present

## 2023-09-15 DIAGNOSIS — Z79899 Other long term (current) drug therapy: Secondary | ICD-10-CM | POA: Insufficient documentation

## 2023-09-15 DIAGNOSIS — O99891 Other specified diseases and conditions complicating pregnancy: Secondary | ICD-10-CM | POA: Insufficient documentation

## 2023-09-15 DIAGNOSIS — N390 Urinary tract infection, site not specified: Secondary | ICD-10-CM | POA: Insufficient documentation

## 2023-09-15 DIAGNOSIS — O2342 Unspecified infection of urinary tract in pregnancy, second trimester: Secondary | ICD-10-CM | POA: Insufficient documentation

## 2023-09-15 DIAGNOSIS — R310 Gross hematuria: Secondary | ICD-10-CM | POA: Diagnosis present

## 2023-09-15 DIAGNOSIS — Z7982 Long term (current) use of aspirin: Secondary | ICD-10-CM | POA: Diagnosis not present

## 2023-09-15 LAB — CBC
HCT: 33.2 % — ABNORMAL LOW (ref 36.0–46.0)
Hemoglobin: 11.1 g/dL — ABNORMAL LOW (ref 12.0–15.0)
MCH: 28.3 pg (ref 26.0–34.0)
MCHC: 33.4 g/dL (ref 30.0–36.0)
MCV: 84.7 fL (ref 80.0–100.0)
Platelets: 191 10*3/uL (ref 150–400)
RBC: 3.92 MIL/uL (ref 3.87–5.11)
RDW: 13.1 % (ref 11.5–15.5)
WBC: 9.3 10*3/uL (ref 4.0–10.5)
nRBC: 0 % (ref 0.0–0.2)

## 2023-09-15 LAB — WET PREP, GENITAL
Clue Cells Wet Prep HPF POC: NONE SEEN
Sperm: NONE SEEN
Trich, Wet Prep: NONE SEEN
WBC, Wet Prep HPF POC: >=10 — AB (ref <10)
Yeast Wet Prep HPF POC: NONE SEEN

## 2023-09-15 LAB — BASIC METABOLIC PANEL WITH GFR
Anion gap: 10 (ref 5–15)
BUN: 9 mg/dL (ref 6–20)
CO2: 19 mmol/L — ABNORMAL LOW (ref 22–32)
Calcium: 8.8 mg/dL — ABNORMAL LOW (ref 8.9–10.3)
Chloride: 107 mmol/L (ref 98–111)
Creatinine, Ser: 0.52 mg/dL (ref 0.44–1.00)
GFR, Estimated: >60 mL/min (ref >60)
Glucose, Bld: 89 mg/dL (ref 70–99)
Potassium: 4 mmol/L (ref 3.5–5.1)
Sodium: 136 mmol/L (ref 135–145)

## 2023-09-15 LAB — URINALYSIS, ROUTINE W REFLEX MICROSCOPIC
Bilirubin Urine: NEGATIVE
Glucose, UA: NEGATIVE mg/dL
Ketones, ur: NEGATIVE mg/dL
Nitrite: NEGATIVE
Protein, ur: 30 mg/dL — AB
Specific Gravity, Urine: 1.02 (ref 1.005–1.030)
pH: 7.5 (ref 5.0–8.0)

## 2023-09-15 LAB — URINALYSIS, MICROSCOPIC (REFLEX): RBC / HPF: >50 RBC/hpf (ref 0–5)

## 2023-09-15 MED ORDER — CEPHALEXIN 500 MG PO CAPS: 500.0000 mg | ORAL_CAPSULE | Freq: Four times a day (QID) | ORAL | 0 refills | Status: DC

## 2023-09-15 MED ORDER — TAMSULOSIN HCL 0.4 MG PO CAPS: 0.4000 mg | ORAL_CAPSULE | Freq: Every day | ORAL | 0 refills | Status: DC

## 2023-09-15 NOTE — MAU Provider Note (Signed)
 Chief Complaint:  Hematuria   HPI   None     Jamie Escobar is a 23 y.o. G3P2002 at [redacted]w[redacted]d who presents to maternity admissions reporting gross hematuria. She reports that around 1530 she used the bathroom and saw that the stream was a brown color that she believes is blood. She has had some right hip soreness that started this morning, otherwise denies abdominal or back pain. Denies dysuria, urinary frequency, recent URI, rash. No recent changes to medication. Denies vaginal bleeding, vaginal discharge, leaking of fluid. Endorses good fetal movement.  Additional history obtained from partner.  Pregnancy Course: Receives care at [facility]. Prenatal records reviewed. Pregnancy complicated by obesity. She is taking prenatal, Zoloft, aspirin  81 mg daily.  Past Medical History:  Diagnosis Date   Chronic rhinitis 02/01/2020   Eczema    Hydronephrosis    Menorrhagia with irregular cycle 10/23/2017   Skin lesion 10/23/2017   Urticaria    OB History  Gravida Para Term Preterm AB Living  3 2 2   2   SAB IAB Ectopic Multiple Live Births     0 2    # Outcome Date GA Lbr Len/2nd Weight Sex Type Anes PTL Lv  3 Current           2 Term 06/09/21 [redacted]w[redacted]d 15:33 / 00:12 3295 g F Vag-Spont EPI  LIV  1 Term 11/03/15 [redacted]w[redacted]d 14:50 / 03:19 2620 g M Vag-Spont EPI  LIV     Birth Comments: none   Past Surgical History:  Procedure Laterality Date   FOOT SURGERY Right    Family History  Problem Relation Age of Onset   Cancer Father        brain    Diabetes Maternal Grandmother    Heart disease Maternal Grandmother    Allergic rhinitis Neg Hx    Angioedema Neg Hx    Asthma Neg Hx    Atopy Neg Hx    Eczema Neg Hx    Immunodeficiency Neg Hx    Urticaria Neg Hx    Social History   Tobacco Use   Smoking status: Former    Types: E-cigarettes   Smokeless tobacco: Never  Vaping Use   Vaping status: Former   Substances: Nicotine  Substance Use Topics   Alcohol use: No   Drug use: Not  Currently    Types: Marijuana   No Known Allergies Medications Prior to Admission  Medication Sig Dispense Refill Last Dose/Taking   Prenatal Vit-Fe Fumarate-FA (PREPLUS) 27-1 MG TABS Take 1 tablet by mouth daily. 30 tablet 13 09/15/2023   aspirin  EC 81 MG tablet Take 1 tablet (81 mg total) by mouth at bedtime. Start taking when you are [redacted] weeks pregnant for rest of pregnancy for prevention of preeclampsia 300 tablet 2    cyclobenzaprine  (FLEXERIL ) 10 MG tablet Take 1 tablet (10 mg total) by mouth 3 (three) times daily as needed for muscle spasms. 30 tablet 2    sertraline (ZOLOFT) 50 MG tablet Take 50 mg by mouth daily.       I have reviewed patient's Past Medical Hx, Surgical Hx, Family Hx, Social Hx, medications and allergies.   ROS  Pertinent items noted in HPI and remainder of comprehensive ROS otherwise negative.   PHYSICAL EXAM  Patient Vitals for the past 24 hrs:  BP Temp Temp src Pulse Resp SpO2 Height Weight  09/15/23 1629 121/69 98 F (36.7 C) Oral 83 16 100 % 5\' 2"  (1.575 m) 78.7 kg  Constitutional: Well-developed, well-nourished female in no acute distress.  HEENT: atraumatic, normocephalic. Neck has normal ROM. EOM intact. Cardiovascular: normal rate & rhythm, warm and well-perfused Respiratory: normal effort, no problems with respiration noted GI: Abd soft, non-tender, non-distended MSK: Extremities nontender, no edema, normal ROM Skin: warm and dry. Acyanotic, no jaundice or pallor. Neurologic: Alert and oriented x 4. No abnormal coordination. Psychiatric: Normal mood. Speech not slurred, not rapid/pressured. Patient is cooperative. GU: no CVA tenderness     Fetal Tracing: Baseline FHR: 150 per minute Fetal heart variability: moderate Fetal Heart Rate accelerations: yes Fetal Heart Rate decelerations: none Fetal Non-stress Test: reactive Toco: uterine irritability  Labs: Results for orders placed or performed during the hospital encounter of 09/15/23 (from  the past 24 hours)  CBC     Status: Abnormal   Collection Time: 09/15/23  4:56 PM  Result Value Ref Range   WBC 9.3 4.0 - 10.5 K/uL   RBC 3.92 3.87 - 5.11 MIL/uL   Hemoglobin 11.1 (L) 12.0 - 15.0 g/dL   HCT 16.1 (L) 09.6 - 04.5 %   MCV 84.7 80.0 - 100.0 fL   MCH 28.3 26.0 - 34.0 pg   MCHC 33.4 30.0 - 36.0 g/dL   RDW 40.9 81.1 - 91.4 %   Platelets 191 150 - 400 K/uL   nRBC 0.0 0.0 - 0.2 %  Basic metabolic panel     Status: Abnormal   Collection Time: 09/15/23  4:56 PM  Result Value Ref Range   Sodium 136 135 - 145 mmol/L   Potassium 4.0 3.5 - 5.1 mmol/L   Chloride 107 98 - 111 mmol/L   CO2 19 (L) 22 - 32 mmol/L   Glucose, Bld 89 70 - 99 mg/dL   BUN 9 6 - 20 mg/dL   Creatinine, Ser 7.82 0.44 - 1.00 mg/dL   Calcium 8.8 (L) 8.9 - 10.3 mg/dL   GFR, Estimated >95 >62 mL/min   Anion gap 10 5 - 15  Urinalysis, Routine w reflex microscopic -Urine, Clean Catch     Status: Abnormal   Collection Time: 09/15/23  5:08 PM  Result Value Ref Range   Color, Urine AMBER (A) YELLOW   APPearance CLOUDY (A) CLEAR   Specific Gravity, Urine 1.020 1.005 - 1.030   pH 7.5 5.0 - 8.0   Glucose, UA NEGATIVE NEGATIVE mg/dL   Hgb urine dipstick LARGE (A) NEGATIVE   Bilirubin Urine NEGATIVE NEGATIVE   Ketones, ur NEGATIVE NEGATIVE mg/dL   Protein, ur 30 (A) NEGATIVE mg/dL   Nitrite NEGATIVE NEGATIVE   Leukocytes,Ua TRACE (A) NEGATIVE  Urinalysis, Microscopic (reflex)     Status: Abnormal   Collection Time: 09/15/23  5:08 PM  Result Value Ref Range   RBC / HPF >50 0 - 5 RBC/hpf   WBC, UA 0-5 0 - 5 WBC/hpf   Bacteria, UA RARE (A) NONE SEEN   Squamous Epithelial / HPF 0-5 0 - 5 /HPF   Mucus PRESENT   Wet prep, genital     Status: Abnormal   Collection Time: 09/15/23  5:13 PM   Specimen: Vaginal  Result Value Ref Range   Yeast Wet Prep HPF POC NONE SEEN NONE SEEN   Trich, Wet Prep NONE SEEN NONE SEEN   Clue Cells Wet Prep HPF POC NONE SEEN NONE SEEN   WBC, Wet Prep HPF POC >=10 (A) <10   Sperm  NONE SEEN     Imaging:  US  RENAL Result Date: 09/15/2023 CLINICAL DATA:  Hershell Lose  hematuria EXAM: RENAL / URINARY TRACT ULTRASOUND COMPLETE COMPARISON:  None Available. FINDINGS: Right Kidney: Renal measurements: 14.2 x 5.4 x 7 = volume: 293 mL. Moderate right hydronephrosis. Normal echotexture. No mass. Left Kidney: Renal measurements: 12.8 x 6.7 x 5.5 cm = volume: 247 mL. Mild left hydronephrosis. No mass. Normal echotexture. Bladder: Appears normal for degree of bladder distention. Other: None. IMPRESSION: Bilateral hydronephrosis, right greater than left. Electronically Signed   By: Janeece Mechanic M.D.   On: 09/15/2023 19:52    MDM & MAU COURSE  MDM: High  MAU Course: -Vital signs within normal limits. -CBC for anemia, BMP for renal function, urinalysis for urine contents. Wet prep to rule out vaginal infection. -BMP within normal limits, CBC with Hemoglobin 11.1 normal in pregnancy. -Hgb and protein present on UA with >50 RBC on microscopy. Urine is amber color. -Proceed with renal and bladder US  to rule out hydronephrosis, ureteral obstruction or urolithiasis/nephrolithiasis. -Renal US  shows bilateral hydronephrosis with R>L.  -Discussed CT imaging to definitively rule out kidney stones. After discussing risks versus benefits, patient prefers empiric treatment rather than imaging at this time.  Differential diagnosis considered for hematuria includes but is not limited to: UTI, pyelonephritis, nephrolithiasis, urethritis, rhabdomyolysis    Orders Placed This Encounter  Procedures   Wet prep, genital   US  RENAL   Urinalysis, Routine w reflex microscopic -Urine, Clean Catch   CBC   Basic metabolic panel   Urinalysis, Microscopic (reflex)   Discharge patient   Meds ordered this encounter  Medications   cephALEXin (KEFLEX) 500 MG capsule    Sig: Take 1 capsule (500 mg total) by mouth 4 (four) times daily.    Dispense:  28 capsule    Refill:  0   tamsulosin (FLOMAX) 0.4 MG CAPS  capsule    Sig: Take 1 capsule (0.4 mg total) by mouth daily. Treat for 7 days.    Dispense:  10 capsule    Refill:  0    ASSESSMENT   1. Gross hematuria   2. [redacted] weeks gestation of pregnancy     PLAN  Discharge home in stable condition with return precautions.  Treat empirically  for UTI with cephalexin. Treat empirically for nephrolithiasis with Tylenol , tamsulosin, and increased hydration. If symptoms worsen or new symptoms develop, return to MAU.   Follow-up Information     [facility] for [facility] Follow up.   Specialty: Obstetrics and Gynecology Why: As scheduled for ongoing prenatal care Contact information: [address] [place]  [phone]                 Allergies as of 09/15/2023   No Known Allergies      Medication List     TAKE these medications    aspirin  EC 81 MG tablet Take 1 tablet (81 mg total) by mouth at bedtime. Start taking when you are [redacted] weeks pregnant for rest of pregnancy for prevention of preeclampsia   cephALEXin 500 MG capsule Commonly known as: KEFLEX Take 1 capsule (500 mg total) by mouth 4 (four) times daily.   cyclobenzaprine  10 MG tablet Commonly known as: FLEXERIL  Take 1 tablet (10 mg total) by mouth 3 (three) times daily as needed for muscle spasms.   PrePLUS 27-1 MG Tabs Take 1 tablet by mouth daily.   sertraline 50 MG tablet Commonly known as: ZOLOFT Take 50 mg by mouth daily.   tamsulosin 0.4 MG Caps capsule Commonly known  as: FLOMAX Take 1 capsule (0.4 mg total) by mouth daily. Treat for 7 days.        Noreene Bearded, PA

## 2023-09-15 NOTE — MAU Note (Signed)
 MAU Triage Note:  .Jamie Escobar is a 23 y.o. at [redacted]w[redacted]d here in MAU reporting: started peeing "brown blood" around 30 minutes to an hour ago. Reports some right hip soreness. Denies VB or LOF. Reports +FM.   Patient complaint: Urinating blood, Rt hip Pn  Pain Score: 4  Pain Location: Hip     Onset of complaint: today LMP: Patient's last menstrual period was 03/06/2023.  Vitals:   09/15/23 1629  BP: 121/69  Pulse: 83  Resp: 16  Temp: 98 F (36.7 C)  SpO2: 100%    FHT:  Fetal Heart Rate Mode: Doppler Baseline Rate (A): 164 bpm Multiple birth?: No Lab orders placed from triage: UA

## 2023-09-15 NOTE — Discharge Instructions (Addendum)
 Thank you for allowing us  to take care of you today.  We will treat you for a presumed UTI and kidney stone. I have sent the antibiotic cephalexin (Keflex) to the pharmacy for the UTI. The medicine tamsulosin can be helpful for kidney stones. Alternatively, you can take Tylenol  500 mg every 4 hours as needed for pain. This should all be done with drinking a ton of water, aiming for 96 ounces each day!  If symptoms worsen OR you develop new symptoms like abdominal pain, fever, chills, or vomiting, come back to the MAU.

## 2023-09-15 NOTE — Telephone Encounter (Signed)
 Pt called stating that she is urinating straight blood, started earlier today. Pt denies any pain with urination. Pt also denies any abdominal pain. I spoke with Dr. Thurmon [place] who advised Pt to go to MAU and I will advise the providers that Pt is coming.

## 2023-09-24 ENCOUNTER — Ambulatory Visit: Admitting: Obstetrics & Gynecology

## 2023-09-24 ENCOUNTER — Encounter: Payer: Self-pay | Admitting: Obstetrics & Gynecology

## 2023-09-24 ENCOUNTER — Other Ambulatory Visit

## 2023-09-24 VITALS — BP 111/74 | HR 71 | Wt 178.0 lb

## 2023-09-24 DIAGNOSIS — O99213 Obesity complicating pregnancy, third trimester: Secondary | ICD-10-CM

## 2023-09-24 DIAGNOSIS — Z3A28 28 weeks gestation of pregnancy: Secondary | ICD-10-CM | POA: Diagnosis not present

## 2023-09-24 DIAGNOSIS — Z23 Encounter for immunization: Secondary | ICD-10-CM

## 2023-09-24 DIAGNOSIS — O0990 Supervision of high risk pregnancy, unspecified, unspecified trimester: Secondary | ICD-10-CM

## 2023-09-24 NOTE — Progress Notes (Signed)
   PRENATAL VISIT NOTE  Subjective:  Jamie Escobar is a 23 y.o. G3P2002 at [redacted]w[redacted]d being seen today for ongoing prenatal care.  She is currently monitored for the following issues for this high-risk pregnancy and has Obesity affecting pregnancy; Supervision of high risk pregnancy, antepartum; and Generalized anxiety disorder on their problem list.  Patient reports no complaints.  Contractions: Not present. Vag. Bleeding: None.  Movement: Present. Denies leaking of fluid.   The following portions of the patient's history were reviewed and updated as appropriate: allergies, current medications, past family history, past medical history, past social history, past surgical history and problem list.   Objective:    Vitals:   09/24/23 0830  BP: 111/74  Pulse: 71  Weight: 178 lb (80.7 kg)    Fetal Status:  Fetal Heart Rate (bpm): 143-144 Fundal Height: 28 cm Movement: Present    General: Alert, oriented and cooperative. Patient is in no acute distress.  Skin: Skin is warm and dry. No rash noted.   Cardiovascular: Normal heart rate noted  Respiratory: Normal respiratory effort, no problems with respiration noted  Abdomen: Soft, gravid, appropriate for gestational age.  Pain/Pressure: Absent     Pelvic: Cervical exam deferred        Extremities: Normal range of motion.  Edema: None  Mental Status: Normal mood and affect. Normal behavior. Normal judgment and thought content.   Assessment and Plan:  Pregnancy: G3P2002 at [redacted]w[redacted]d 1. Obesity affecting pregnancy in third trimester TWG 8 lbs, doing well  2. [redacted] weeks gestation of pregnancy 3. Supervision of high risk pregnancy, antepartum (Primary) Third trimester labs done today, will follow up results and manage accordingly. Tdap given. Third trimester expectations reviewed and all questions answered. Preterm labor symptoms and general obstetric precautions including but not limited to vaginal bleeding, contractions, leaking of fluid and  fetal movement were reviewed in detail with the patient. Please refer to After Visit Summary for other counseling recommendations.   Return in about 4 weeks (around 10/22/2023) for OFFICE OB VISIT (MD or APP) - 32 week Babyscripts appointment.  No future appointments.  Lenoard Rad, MD

## 2023-09-25 ENCOUNTER — Ambulatory Visit: Payer: Self-pay | Admitting: Obstetrics & Gynecology

## 2023-09-25 DIAGNOSIS — O0990 Supervision of high risk pregnancy, unspecified, unspecified trimester: Secondary | ICD-10-CM

## 2023-09-25 LAB — GLUCOSE TOLERANCE, 2 HOURS W/ 1HR
Glucose, 1 hour: 91 mg/dL (ref 70–179)
Glucose, 2 hour: 67 mg/dL — ABNORMAL LOW (ref 70–152)
Glucose, Fasting: 77 mg/dL (ref 70–91)

## 2023-09-25 LAB — CBC
Hematocrit: 35.5 % (ref 34.0–46.6)
Hemoglobin: 11.5 g/dL (ref 11.1–15.9)
MCH: 28.5 pg (ref 26.6–33.0)
MCHC: 32.4 g/dL (ref 31.5–35.7)
MCV: 88 fL (ref 79–97)
Platelets: 181 10*3/uL (ref 150–450)
RBC: 4.04 x10E6/uL (ref 3.77–5.28)
RDW: 12.5 % (ref 11.7–15.4)
WBC: 7.6 10*3/uL (ref 3.4–10.8)

## 2023-09-25 LAB — RPR: RPR Ser Ql: NONREACTIVE

## 2023-09-25 LAB — HIV ANTIBODY (ROUTINE TESTING W REFLEX): HIV Screen 4th Generation wRfx: NONREACTIVE

## 2023-10-22 ENCOUNTER — Encounter: Payer: Self-pay | Admitting: Obstetrics and Gynecology

## 2023-10-22 ENCOUNTER — Ambulatory Visit (INDEPENDENT_AMBULATORY_CARE_PROVIDER_SITE_OTHER): Admitting: Obstetrics and Gynecology

## 2023-10-22 VITALS — BP 115/75 | HR 73 | Wt 181.0 lb

## 2023-10-22 DIAGNOSIS — O99213 Obesity complicating pregnancy, third trimester: Secondary | ICD-10-CM | POA: Diagnosis not present

## 2023-10-22 DIAGNOSIS — O0990 Supervision of high risk pregnancy, unspecified, unspecified trimester: Secondary | ICD-10-CM

## 2023-10-22 NOTE — Progress Notes (Addendum)
   PRENATAL VISIT NOTE  Subjective:  Jamie Escobar is a 23 y.o. G3P2002 at [redacted]w[redacted]d being seen today for ongoing prenatal care.  She is currently monitored for the following issues for this low-risk pregnancy and has Obesity affecting pregnancy; Supervision of high risk pregnancy, antepartum; and Generalized anxiety disorder on their problem list.  Patient reports no complaints.  Contractions: Not present. Vag. Bleeding: None.  Movement: Present. Denies leaking of fluid.   The following portions of the patient's history were reviewed and updated as appropriate: allergies, current medications, past family history, past medical history, past social history, past surgical history and problem list.   Objective:    Vitals:   10/22/23 1119  BP: 115/75  Pulse: 73  Weight: 181 lb (82.1 kg)    Fetal Status:  Fetal Heart Rate (bpm): 155 Fundal Height: 32 cm Movement: Present    General: Alert, oriented and cooperative. Patient is in no acute distress.  Skin: Skin is warm and dry. No rash noted.   Cardiovascular: Normal heart rate noted  Respiratory: Normal respiratory effort, no problems with respiration noted  Abdomen: Soft, gravid, appropriate for gestational age.  Pain/Pressure: Absent     Pelvic: Cervical exam deferred        Extremities: Normal range of motion.  Edema: None  Mental Status: Normal mood and affect. Normal behavior. Normal judgment and thought content.   Assessment and Plan:  Pregnancy: G3P2002 at [redacted]w[redacted]d 1. Supervision of high risk pregnancy, antepartum (Primary) Patient is doing well without complaints Cultures next visit  2. Obesity affecting pregnancy in third trimester, unspecified obesity type TWG 11 lb   Preterm labor symptoms and general obstetric precautions including but not limited to vaginal bleeding, contractions, leaking of fluid and fetal movement were reviewed in detail with the patient. Please refer to After Visit Summary for other counseling  recommendations.   Return in about 4 weeks (around 11/19/2023) for in person, ROB, Low risk.  Future Appointments  Date Time Provider Department Center  11/19/2023 11:15 AM Granville Layer, MD [facility]  12/03/2023 11:15 AM Geron Mulford, Carles Cheadle, MD [facility]  12/10/2023 11:15 AM Granville Layer, MD [facility]    Verlyn Goad, MD

## 2023-10-22 NOTE — Progress Notes (Signed)
 ROB: denies any concerns

## 2023-11-16 ENCOUNTER — Other Ambulatory Visit: Payer: Self-pay | Admitting: *Deleted

## 2023-11-16 MED ORDER — FLUCONAZOLE 150 MG PO TABS
150.0000 mg | ORAL_TABLET | Freq: Once | ORAL | 0 refills | Status: AC
Start: 2023-11-16 — End: 2023-11-16

## 2023-11-19 ENCOUNTER — Ambulatory Visit: Admitting: Family Medicine

## 2023-11-19 ENCOUNTER — Encounter: Payer: Self-pay | Admitting: Family Medicine

## 2023-11-19 ENCOUNTER — Other Ambulatory Visit (HOSPITAL_COMMUNITY)
Admission: RE | Admit: 2023-11-19 | Discharge: 2023-11-19 | Disposition: A | Discharge status: Home / Self Care | Source: Ambulatory Visit | Attending: Family Medicine | Admitting: Family Medicine

## 2023-11-19 VITALS — BP 115/74 | HR 90 | Wt 187.0 lb

## 2023-11-19 DIAGNOSIS — Z113 Encounter for screening for infections with a predominantly sexual mode of transmission: Secondary | ICD-10-CM | POA: Insufficient documentation

## 2023-11-19 DIAGNOSIS — O365931 Maternal care for other known or suspected poor fetal growth, third trimester, fetus 1: Secondary | ICD-10-CM

## 2023-11-19 DIAGNOSIS — Z3A36 36 weeks gestation of pregnancy: Secondary | ICD-10-CM | POA: Insufficient documentation

## 2023-11-19 DIAGNOSIS — F411 Generalized anxiety disorder: Secondary | ICD-10-CM

## 2023-11-19 DIAGNOSIS — O0990 Supervision of high risk pregnancy, unspecified, unspecified trimester: Secondary | ICD-10-CM | POA: Diagnosis not present

## 2023-11-19 NOTE — Progress Notes (Signed)
   PRENATAL VISIT NOTE  Subjective:  Jamie Escobar is a 23 y.o. G3P2002 at [redacted]w[redacted]d being seen today for ongoing prenatal care.  She is currently monitored for the following issues for this low-risk pregnancy and has Obesity affecting pregnancy; Supervision of high risk pregnancy, antepartum; and Generalized anxiety disorder on their problem list.  Patient reports no complaints.  Contractions: Not present. Vag. Bleeding: None.  Movement: Present. Denies leaking of fluid.   The following portions of the patient's history were reviewed and updated as appropriate: allergies, current medications, past family history, past medical history, past social history, past surgical history and problem list.   Objective:    Vitals:   11/19/23 1117  BP: 115/74  Pulse: 90  Weight: 187 lb (84.8 kg)    Fetal Status:  Fetal Heart Rate (bpm): 156 Fundal Height: 33 cm Movement: Present Presentation: Vertex  General: Alert, oriented and cooperative. Patient is in no acute distress.  Skin: Skin is warm and dry. No rash noted.   Cardiovascular: Normal heart rate noted  Respiratory: Normal respiratory effort, no problems with respiration noted  Abdomen: Soft, gravid, appropriate for gestational age.  Pain/Pressure: Absent     Pelvic: Cervical exam deferred Dilation: Fingertip Effacement (%): 20    Extremities: Normal range of motion.  Edema: None  Mental Status: Normal mood and affect. Normal behavior. Normal judgment and thought content.   Assessment and Plan:  Pregnancy: G3P2002 at [redacted]w[redacted]d 1. Supervision of high risk pregnancy, antepartum (Primary) Cultures today  2. Generalized anxiety disorder On Zoloft  3. [redacted] weeks gestation of pregnancy - Cervicovaginal ancillary only - Strep Gp B NAA  4. Poor fetal growth affecting management of mother in third trimester, fetus 1 of multiple gestation S<D check u/s - US  MFM OB FOLLOW UP; Future  Preterm labor symptoms and general obstetric precautions  including but not limited to vaginal bleeding, contractions, leaking of fluid and fetal movement were reviewed in detail with the patient. Please refer to After Visit Summary for other counseling recommendations.   Return in 2 weeks (on 12/03/2023) for needs U/S.  Future Appointments  Date Time Provider Department Center  12/03/2023 11:15 AM Constant, Winton, MD [facility]  12/10/2023 11:15 AM Fredirick Glenys RAMAN, MD [facility]    Glenys RAMAN Fredirick, MD

## 2023-11-19 NOTE — Progress Notes (Signed)
 ROB   CC: None

## 2023-11-20 LAB — CERVICOVAGINAL ANCILLARY ONLY
Chlamydia: NEGATIVE
Comment: NEGATIVE
Comment: NORMAL
Neisseria Gonorrhea: NEGATIVE

## 2023-11-21 LAB — STREP GP B NAA: Strep Gp B NAA: POSITIVE — AB

## 2023-11-24 ENCOUNTER — Ambulatory Visit: Payer: Self-pay | Admitting: Family Medicine

## 2023-11-24 DIAGNOSIS — O99820 Streptococcus B carrier state complicating pregnancy: Secondary | ICD-10-CM | POA: Insufficient documentation

## 2023-12-03 ENCOUNTER — Ambulatory Visit: Admitting: Obstetrics & Gynecology

## 2023-12-03 ENCOUNTER — Encounter: Payer: Self-pay | Admitting: Obstetrics & Gynecology

## 2023-12-03 VITALS — BP 120/77 | HR 84 | Wt 191.0 lb

## 2023-12-03 DIAGNOSIS — O0990 Supervision of high risk pregnancy, unspecified, unspecified trimester: Secondary | ICD-10-CM

## 2023-12-03 DIAGNOSIS — Z3A38 38 weeks gestation of pregnancy: Secondary | ICD-10-CM

## 2023-12-03 NOTE — Progress Notes (Signed)
   PRENATAL VISIT NOTE  Subjective:  Jamie Escobar is a 23 y.o. G3P2002 at [redacted]w[redacted]d being seen today for ongoing prenatal care.  She is currently monitored for the following issues for this high-risk pregnancy and has Obesity affecting pregnancy; Supervision of high risk pregnancy, antepartum; Generalized anxiety disorder; and GBS (group B Streptococcus carrier), +RV culture, currently pregnant on their problem list.  Patient reports no complaints.  Contractions: Not present. Vag. Bleeding: None.  Movement: Present. Denies leaking of fluid.   The following portions of the patient's history were reviewed and updated as appropriate: allergies, current medications, past family history, past medical history, past social history, past surgical history and problem list.   Objective:    Vitals:   12/03/23 1111  BP: 120/77  Pulse: 84  Weight: 191 lb (86.6 kg)    Fetal Status:  Fetal Heart Rate (bpm): 152 Fundal Height: 37 cm Movement: Present Presentation: Vertex  General: Alert, oriented and cooperative. Patient is in no acute distress.  Skin: Skin is warm and dry. No rash noted.   Cardiovascular: Normal heart rate noted  Respiratory: Normal respiratory effort, no problems with respiration noted  Abdomen: Soft, gravid, appropriate for gestational age.  Pain/Pressure: Absent     Pelvic: Cervical exam performed in the presence of a chaperone Dilation: 1 Effacement (%): Thick Station: Ballotable  Extremities: Normal range of motion.  Edema: None  Mental Status: Normal mood and affect. Normal behavior. Normal judgment and thought content.   Assessment and Plan:  Pregnancy: G3P2002 at [redacted]w[redacted]d 1. [redacted] weeks gestation of pregnancy 2. Supervision of high risk pregnancy, antepartum (Primary) Discussed postdates IOL after 40 weeks, scheduled on 8/4. Also discussed postdates IOL at 41 weeks, scheduled on 8/8/ [place], orders signed and held. Gave suggestions about what to do to help with cervical  ripening, maybe do cervical sweeping next visit. Term labor symptoms and general obstetric precautions including but not limited to vaginal bleeding, contractions, leaking of fluid and fetal movement were reviewed in detail with the patient. Please refer to After Visit Summary for other counseling recommendations.   Return for OB visits and antenatal testing as scheduled.  Future Appointments  Date Time Provider Department Center  12/10/2023 11:15 AM Fredirick Glenys RAMAN, MD [facility]  12/14/2023  2:10 PM [facility]  12/14/2023  3:30 PM Dalayza Zambrana, Gloris DELENA, MD [facility]  12/18/2023 12:00 AM [facility] SCHED ROOM [facility] None    Gloris Hugger, MD

## 2023-12-03 NOTE — Patient Instructions (Signed)
  Things to Try for Cervical Ripening (to get your cervix ready for labor) : May try one or all:   Try the [employer] at [email or web address].[email or web address] daily to improve baby's position and encourage the onset of labor.  Cervical Ripening: May try one or both Red Raspberry Leaf capsules or tea:  two 300mg  or 400mg  tablets with each meal, 2-3 times a day, or 1-3 cups of tea daily  Potential Side Effects Of Raspberry Leaf:  Most women do not experience any side effects from drinking raspberry leaf tea. However, nausea and loose stools are possible   Evening Primrose Oil capsules: take 1 capsule by mouth and place one capsule in the vagina every night.    Some of the potential side effects:  Upset stomach  Loose stools or diarrhea  Headaches  Nausea  3.  6 Dates a day (may taste better if warmed in microwave until soft). Found where raisins are in the grocery store   Return to office for any scheduled appointments. Call the office or go to the MAU at [facility] if: You begin to have strong, frequent contractions Your water breaks.  Sometimes it is a big gush of fluid, sometimes it is just a trickle that keeps getting your underwear wet or running down your legs You have vaginal bleeding.  It is normal to have a small amount of spotting if your cervix was checked.  You do not feel your baby moving like normal.  If you do not, get something to eat and drink and lay down and focus on feeling your baby move.   If your baby is still not moving like normal, you should call the office or go to MAU. Any other obstetric concerns.

## 2023-12-10 ENCOUNTER — Ambulatory Visit (INDEPENDENT_AMBULATORY_CARE_PROVIDER_SITE_OTHER): Admitting: Family Medicine

## 2023-12-10 ENCOUNTER — Telehealth (HOSPITAL_COMMUNITY): Payer: Self-pay | Admitting: *Deleted

## 2023-12-10 ENCOUNTER — Encounter (HOSPITAL_COMMUNITY): Payer: Self-pay | Admitting: *Deleted

## 2023-12-10 VITALS — BP 122/79 | HR 69 | Wt 191.0 lb

## 2023-12-10 DIAGNOSIS — O99820 Streptococcus B carrier state complicating pregnancy: Secondary | ICD-10-CM | POA: Diagnosis not present

## 2023-12-10 DIAGNOSIS — F411 Generalized anxiety disorder: Secondary | ICD-10-CM

## 2023-12-10 DIAGNOSIS — O0990 Supervision of high risk pregnancy, unspecified, unspecified trimester: Secondary | ICD-10-CM | POA: Diagnosis not present

## 2023-12-10 DIAGNOSIS — Z3A39 39 weeks gestation of pregnancy: Secondary | ICD-10-CM

## 2023-12-10 NOTE — Progress Notes (Signed)
    PRENATAL VISIT NOTE  Subjective:  Jamie Escobar is a 23 y.o. G3P2002 at [redacted]w[redacted]d being seen today for ongoing prenatal care.  She is currently monitored for the following issues for this low-risk pregnancy and has Obesity affecting pregnancy; Supervision of high risk pregnancy, antepartum; Generalized anxiety disorder; and GBS (group B Streptococcus carrier), +RV culture, currently pregnant on their problem list.  Patient reports no complaints.  Contractions: Irregular. Vag. Bleeding: None.  Movement: Present. Denies leaking of fluid.   The following portions of the patient's history were reviewed and updated as appropriate: allergies, current medications, past family history, past medical history, past social history, past surgical history and problem list.   Objective:    Vitals:   12/10/23 1108  BP: 122/79  Pulse: 69  Weight: 191 lb (86.6 kg)    Fetal Status:  Fetal Heart Rate (bpm): 132-135 Fundal Height: 36 cm Movement: Present Presentation: Vertex  General: Alert, oriented and cooperative. Patient is in no acute distress.  Skin: Skin is warm and dry. No rash noted.   Cardiovascular: Normal heart rate noted  Respiratory: Normal respiratory effort, no problems with respiration noted  Abdomen: Soft, gravid, appropriate for gestational age.  Pain/Pressure: Present     Pelvic: Cervical exam performed in the presence of a chaperone Dilation: 1.5 Effacement (%): 50, 60 Station: -3  Extremities: Normal range of motion.  Edema: None  Mental Status: Normal mood and affect. Normal behavior. Normal judgment and thought content.   Assessment and Plan:  Pregnancy: G3P2002 at [redacted]w[redacted]d 1. Supervision of high risk pregnancy, antepartum (Primary) Membranes stripped today  2. GBS (group B Streptococcus carrier), +RV culture, currently pregnant Will need treatment in labor  3. Generalized anxiety disorder On Zoloft  4. [redacted] weeks gestation of pregnancy   Term labor symptoms and  general obstetric precautions including but not limited to vaginal bleeding, contractions, leaking of fluid and fetal movement were reviewed in detail with the patient. Please refer to After Visit Summary for other counseling recommendations.   Return in 1 week (on 12/17/2023).  Future Appointments  Date Time Provider Department Center  12/14/2023  2:10 PM [facility] NST [facility]  12/14/2023  3:30 PM Anyanwu, Gloris DELENA, MD [facility]  12/18/2023 12:00 AM [facility] SCHED ROOM [facility] None    Glenys GORMAN Birk, MD

## 2023-12-10 NOTE — Telephone Encounter (Signed)
 Preadmission screen

## 2023-12-10 NOTE — Progress Notes (Signed)
 ROB: wants to be checked

## 2023-12-14 ENCOUNTER — Ambulatory Visit: Admitting: *Deleted

## 2023-12-14 ENCOUNTER — Other Ambulatory Visit: Payer: Self-pay

## 2023-12-14 ENCOUNTER — Inpatient Hospital Stay (HOSPITAL_COMMUNITY)
Admission: RE | Admit: 2023-12-14 | Discharge: 2023-12-16 | DRG: 806 | Disposition: A | Discharge status: Home / Self Care | Attending: Obstetrics & Gynecology | Admitting: Obstetrics & Gynecology

## 2023-12-14 ENCOUNTER — Encounter (HOSPITAL_COMMUNITY): Payer: Self-pay | Admitting: Obstetrics & Gynecology

## 2023-12-14 ENCOUNTER — Other Ambulatory Visit (INDEPENDENT_AMBULATORY_CARE_PROVIDER_SITE_OTHER): Payer: Self-pay

## 2023-12-14 ENCOUNTER — Ambulatory Visit (INDEPENDENT_AMBULATORY_CARE_PROVIDER_SITE_OTHER): Admitting: Obstetrics & Gynecology

## 2023-12-14 VITALS — BP 116/76 | HR 87 | Wt 191.0 lb

## 2023-12-14 DIAGNOSIS — O99824 Streptococcus B carrier state complicating childbirth: Secondary | ICD-10-CM | POA: Diagnosis present

## 2023-12-14 DIAGNOSIS — Z833 Family history of diabetes mellitus: Secondary | ICD-10-CM

## 2023-12-14 DIAGNOSIS — Z8249 Family history of ischemic heart disease and other diseases of the circulatory system: Secondary | ICD-10-CM

## 2023-12-14 DIAGNOSIS — Z3A40 40 weeks gestation of pregnancy: Secondary | ICD-10-CM

## 2023-12-14 DIAGNOSIS — O288 Other abnormal findings on antenatal screening of mother: Secondary | ICD-10-CM | POA: Diagnosis not present

## 2023-12-14 DIAGNOSIS — O0990 Supervision of high risk pregnancy, unspecified, unspecified trimester: Secondary | ICD-10-CM

## 2023-12-14 DIAGNOSIS — O480 Post-term pregnancy: Principal | ICD-10-CM | POA: Diagnosis present

## 2023-12-14 DIAGNOSIS — O99820 Streptococcus B carrier state complicating pregnancy: Secondary | ICD-10-CM | POA: Diagnosis not present

## 2023-12-14 DIAGNOSIS — Z87891 Personal history of nicotine dependence: Secondary | ICD-10-CM | POA: Diagnosis not present

## 2023-12-14 DIAGNOSIS — D62 Acute posthemorrhagic anemia: Secondary | ICD-10-CM | POA: Diagnosis not present

## 2023-12-14 DIAGNOSIS — K219 Gastro-esophageal reflux disease without esophagitis: Secondary | ICD-10-CM | POA: Diagnosis present

## 2023-12-14 DIAGNOSIS — O368330 Maternal care for abnormalities of the fetal heart rate or rhythm, third trimester, not applicable or unspecified: Secondary | ICD-10-CM | POA: Diagnosis not present

## 2023-12-14 DIAGNOSIS — O99214 Obesity complicating childbirth: Secondary | ICD-10-CM | POA: Diagnosis present

## 2023-12-14 DIAGNOSIS — F411 Generalized anxiety disorder: Secondary | ICD-10-CM | POA: Diagnosis present

## 2023-12-14 DIAGNOSIS — O9962 Diseases of the digestive system complicating childbirth: Secondary | ICD-10-CM | POA: Diagnosis present

## 2023-12-14 DIAGNOSIS — O99344 Other mental disorders complicating childbirth: Secondary | ICD-10-CM | POA: Diagnosis not present

## 2023-12-14 DIAGNOSIS — O99210 Obesity complicating pregnancy, unspecified trimester: Secondary | ICD-10-CM | POA: Diagnosis present

## 2023-12-14 DIAGNOSIS — O9081 Anemia of the puerperium: Secondary | ICD-10-CM | POA: Diagnosis not present

## 2023-12-14 LAB — CBC
HCT: 34.2 % — ABNORMAL LOW (ref 36.0–46.0)
Hemoglobin: 11.5 g/dL — ABNORMAL LOW (ref 12.0–15.0)
MCH: 28.3 pg (ref 26.0–34.0)
MCHC: 33.6 g/dL (ref 30.0–36.0)
MCV: 84.2 fL (ref 80.0–100.0)
Platelets: 221 K/uL (ref 150–400)
RBC: 4.06 MIL/uL (ref 3.87–5.11)
RDW: 13 % (ref 11.5–15.5)
WBC: 9.4 K/uL (ref 4.0–10.5)
nRBC: 0 % (ref 0.0–0.2)

## 2023-12-14 LAB — TYPE AND SCREEN
ABO/RH(D): O POS
Antibody Screen: NEGATIVE

## 2023-12-14 MED ORDER — FENTANYL CITRATE (PF) 100 MCG/2ML IJ SOLN: 50.0000 ug | INTRAMUSCULAR | Status: DC | PRN

## 2023-12-14 MED ORDER — EPHEDRINE 5 MG/ML INJ: 10.0000 mg | INTRAVENOUS | Status: DC | PRN

## 2023-12-14 MED ORDER — PHENYLEPHRINE 80 MCG/ML (10ML) SYRINGE FOR IV PUSH (FOR BLOOD PRESSURE SUPPORT): 80.0000 ug | PREFILLED_SYRINGE | INTRAVENOUS | Status: DC | PRN

## 2023-12-14 MED ORDER — LACTATED RINGERS IV SOLN: 500.0000 mL | INTRAVENOUS | Status: DC | PRN

## 2023-12-14 MED ORDER — ONDANSETRON HCL 4 MG/2ML IJ SOLN: 4.0000 mg | Freq: Four times a day (QID) | INTRAMUSCULAR | Status: DC | PRN

## 2023-12-14 MED ORDER — FLEET ENEMA RE ENEM: 1.0000 | ENEMA | Freq: Every day | RECTAL | Status: DC | PRN

## 2023-12-14 MED ORDER — MISOPROSTOL 50MCG HALF TABLET: 50.0000 ug | ORAL_TABLET | Freq: Once | ORAL | Status: DC

## 2023-12-14 MED ORDER — DIPHENHYDRAMINE HCL 50 MG/ML IJ SOLN: 12.5000 mg | INTRAMUSCULAR | Status: DC | PRN

## 2023-12-14 MED ORDER — SODIUM CHLORIDE 0.9 % IV SOLN
5.0000 10*6.[IU] | Freq: Once | INTRAVENOUS | Status: AC
  Administered 2023-12-14: 5 10*6.[IU] via INTRAVENOUS
  Filled 2023-12-14: qty 5

## 2023-12-14 MED ORDER — ZOLPIDEM TARTRATE 5 MG PO TABS: 5.0000 mg | ORAL_TABLET | Freq: Every evening | ORAL | Status: DC | PRN

## 2023-12-14 MED ORDER — OXYTOCIN BOLUS FROM INFUSION
333.0000 mL | Freq: Once | INTRAVENOUS | Status: AC
  Administered 2023-12-15: 333 mL via INTRAVENOUS

## 2023-12-14 MED ORDER — ACETAMINOPHEN 325 MG PO TABS: 650.0000 mg | ORAL_TABLET | ORAL | Status: DC | PRN

## 2023-12-14 MED ORDER — LACTATED RINGERS IV SOLN: INTRAVENOUS | Status: DC

## 2023-12-14 MED ORDER — OXYCODONE-ACETAMINOPHEN 5-325 MG PO TABS: 1.0000 | ORAL_TABLET | ORAL | Status: DC | PRN

## 2023-12-14 MED ORDER — TERBUTALINE SULFATE 1 MG/ML IJ SOLN: 0.2500 mg | Freq: Once | INTRAMUSCULAR | Status: DC | PRN

## 2023-12-14 MED ORDER — OXYTOCIN-SODIUM CHLORIDE 30-0.9 UT/500ML-% IV SOLN: 2.5000 [IU]/h | INTRAVENOUS | Status: DC

## 2023-12-14 MED ORDER — HYDROXYZINE HCL 50 MG PO TABS: 50.0000 mg | ORAL_TABLET | Freq: Four times a day (QID) | ORAL | Status: DC | PRN

## 2023-12-14 MED ORDER — FENTANYL-BUPIVACAINE-NACL 0.5-0.125-0.9 MG/250ML-% EP SOLN
12.0000 mL/h | EPIDURAL | Status: DC | PRN
  Administered 2023-12-15: 12 mL/h via EPIDURAL
  Filled 2023-12-14: qty 250

## 2023-12-14 MED ORDER — PENICILLIN G POT IN DEXTROSE 60000 UNIT/ML IV SOLN
3.0000 10*6.[IU] | INTRAVENOUS | Status: DC
  Administered 2023-12-14 – 2023-12-15 (×2): 3 10*6.[IU] via INTRAVENOUS
  Filled 2023-12-14 (×2): qty 50

## 2023-12-14 MED ORDER — MISOPROSTOL 25 MCG QUARTER TABLET: 25.0000 ug | ORAL_TABLET | Freq: Once | ORAL | Status: DC

## 2023-12-14 MED ORDER — OXYCODONE-ACETAMINOPHEN 5-325 MG PO TABS: 2.0000 | ORAL_TABLET | ORAL | Status: DC | PRN

## 2023-12-14 MED ORDER — LIDOCAINE HCL (PF) 1 % IJ SOLN: 30.0000 mL | INTRAMUSCULAR | Status: DC | PRN

## 2023-12-14 MED ORDER — OXYTOCIN-SODIUM CHLORIDE 30-0.9 UT/500ML-% IV SOLN
1.0000 m[IU]/min | INTRAVENOUS | Status: DC
  Administered 2023-12-14: 2 m[IU]/min via INTRAVENOUS
  Filled 2023-12-14: qty 500

## 2023-12-14 MED ORDER — LACTATED RINGERS IV SOLN
500.0000 mL | Freq: Once | INTRAVENOUS | Status: AC
  Administered 2023-12-15: 500 mL via INTRAVENOUS

## 2023-12-14 MED ORDER — SOD CITRATE-CITRIC ACID 500-334 MG/5ML PO SOLN: 30.0000 mL | ORAL | Status: DC | PRN

## 2023-12-14 NOTE — H&P (Addendum)
 OBSTETRIC ADMISSION HISTORY AND PHYSICAL  Jamie Escobar is a 23 y.o. female [phone] with IUP at [redacted]w[redacted]d by LMP presenting for IOL due to decreased variability on NST in the clinic. She reports +FMs, No LOF, no VB, no blurry vision, headaches or peripheral edema, and RUQ pain.  She plans on breast and formula feeding. She request depo for birth control. She received her prenatal [facility] at [facility]   Dating: By LMP --->  Estimated Date of Delivery: 12/11/23  Sono:    @[redacted]w[redacted]d , CWD, normal anatomy, cephalic presentation, 289g, 42% EFW   Prenatal History/Complications: obesity, GAD, GBS+  Past Medical History: Past Medical History:  Diagnosis Date   Chronic rhinitis 02/01/2020   Eczema    Hydronephrosis    Menorrhagia with irregular cycle 10/23/2017   Skin lesion 10/23/2017   Urticaria     Past Surgical History: Past Surgical History:  Procedure Laterality Date   FOOT SURGERY Right     Obstetrical History: OB History     Gravida  3   Para  2   Term  2   Preterm      AB      Living  2      SAB      IAB      Ectopic      Multiple  0   Live Births  2           Social History Social History   Socioeconomic History   Marital status: Significant Other    Spouse name: Not on file   Number of children: Not on file   Years of education: Not on file   Highest education level: Not on file  Occupational History   Not on file  Tobacco Use   Smoking status: Former    Types: E-cigarettes   Smokeless tobacco: Never  Vaping Use   Vaping status: Former   Substances: Nicotine  Substance and Sexual Activity   Alcohol use: No   Drug use: Not Currently    Types: Marijuana   Sexual activity: Yes    Birth control/protection: None  Other Topics Concern   Not on file  Social History Narrative   Not on file   Social Drivers of Health   Financial Resource Strain: Not on file  Food Insecurity: No Food Insecurity (12/14/2023)   Hunger Vital Sign    Worried About  Running Out of Food in the Last Year: Never true    Ran Out of Food in the Last Year: Never true  Transportation Needs: No Transportation Needs (12/14/2023)   PRAPARE - Administrator, Civil Service (Medical): No    Lack of Transportation (Non-Medical): No  Physical Activity: Not on file  Stress: Not on file  Social Connections: Unknown (12/14/2023)   Social Connection and Isolation Panel    Frequency of Communication with Friends and Family: More than three times a week    Frequency of Social Gatherings with Friends and Family: More than three times a week    Attends Religious Services: Not on Marketing executive or Organizations: No    Attends Banker Meetings: Never    Marital Status: Living with partner    Family History: Family History  Problem Relation Age of Onset   Cancer Father        brain    Diabetes Maternal Grandmother    Heart disease Maternal Grandmother    Allergic rhinitis Neg Hx  Angioedema Neg Hx    Asthma Neg Hx    Atopy Neg Hx    Eczema Neg Hx    Immunodeficiency Neg Hx    Urticaria Neg Hx     Allergies: No Known Allergies  Medications Prior to Admission  Medication Sig Dispense Refill Last Dose/Taking   aspirin  EC 81 MG tablet Take 1 tablet (81 mg total) by mouth at bedtime. Start taking when you are [redacted] weeks pregnant for rest of pregnancy for prevention of preeclampsia 300 tablet 2    cephALEXin  (KEFLEX ) 500 MG capsule Take 1 capsule (500 mg total) by mouth 4 (four) times daily. (Patient not taking: Reported on 12/10/2023) 28 capsule 0    cyclobenzaprine  (FLEXERIL ) 10 MG tablet Take 1 tablet (10 mg total) by mouth 3 (three) times daily as needed for muscle spasms. (Patient not taking: Reported on 12/10/2023) 30 tablet 2    Prenatal Vit-Fe Fumarate-FA (PREPLUS) 27-1 MG TABS Take 1 tablet by mouth daily. 30 tablet 13    sertraline  (ZOLOFT ) 50 MG tablet Take 50 mg by mouth daily.      tamsulosin  (FLOMAX ) 0.4 MG CAPS  capsule Take 1 capsule (0.4 mg total) by mouth daily. Treat for 7 days. (Patient not taking: Reported on 12/10/2023) 10 capsule 0      Review of Systems   All systems reviewed and negative except as stated in HPI  Blood pressure 129/75, pulse 86, temperature 98.2 F (36.8 C), temperature source Oral, resp. rate 18, height 5' 1.5 (1.562 m), weight 85.3 kg, last menstrual period 03/06/2023, unknown if currently breastfeeding. General appearance: alert, cooperative, and no distress Lungs: clear to auscultation bilaterally Heart: regular rate and rhythm Abdomen: soft, non-tender; bowel sounds normal Extremities: Homans sign is negative, no sign of DVT Presentation: cephalic Fetal monitoringBaseline: 150 bpm, Variability: Good {> 6 bpm), Accelerations: Reactive, and Decelerations: Absent Uterine activityNone Dilation: 2 Effacement (%): 50 Station: -2 Exam by:: stone rnc   Prenatal labs: ABO, Rh: --/--/O POS (08/04 1654) Antibody: NEG (08/04 1654) Rubella: 1.55 (01/07 0951) RPR: Non Reactive (05/15 1004)  HBsAg: Negative (01/07 0951)  HIV: Non Reactive (05/15 1004)  GBS: Positive/-- (07/10 1100)    Lab Results  Component Value Date   GBS Positive (A) 11/19/2023   GTT normal Genetic screening  Low risk Panorama test Anatomy US : normal, AGA  Immunization History  Administered Date(s) Administered   DTaP 08/12/1999, 10/25/1999, 12/10/1999, 11/26/2000, 11/14/2003   HIB (PRP-T) 08/12/1999, 10/25/1999, 12/10/1999, 11/26/2000   HPV Quadrivalent 01/02/2011, 08/14/2012, 11/08/2013   Hepatitis A, Ped/Adol-2 Dose 11/16/2007, 09/07/2009   Hepatitis B, PED/ADOLESCENT 07/29/1999, 09/13/1999, 04/16/2000   IPV 08/12/1999, 10/25/1999, 06/12/2000, 11/14/2003   Influenza Nasal 02/23/2007   Influenza, Seasonal, Injecte, Preservative Fre 02/09/2023   Influenza,inj,Quad PF,6+ Mos 05/10/2015, 01/29/2016, 02/21/2021   Influenza-Unspecified 05/10/2015, 01/29/2016, 03/21/2020   MMR 07/06/2000,  11/14/2003   Meningococcal Conjugate 01/02/2011   PFIZER Comirnaty(Gray Top)Covid-19 Tri-Sucrose Vaccine 09/25/2020   PFIZER(Purple Top)SARS-COV-2 Vaccination 08/02/2019, 08/25/2019, 03/21/2020, 04/11/2020   Pneumococcal Conjugate PCV 7 08/12/1999, 10/25/1999, 12/10/1999   Pneumococcal Conjugate-13 08/12/1999, 10/25/1999, 12/10/1999   Tdap 09/02/2010, 09/04/2015, 03/21/2021, 09/24/2023   Varicella 07/06/2000, 11/16/2007    Prenatal Transfer Tool  Maternal Diabetes: No Genetic Screening: Normal Maternal Ultrasounds/Referrals: Normal Fetal Ultrasounds or other Referrals:  None Maternal Substance Abuse:  No Significant Maternal Medications:  Meds include: Other: Sertraline  Significant Maternal Lab Results: Group B Strep positive Number of Prenatal Visits:greater than 3 verified prenatal visits Maternal Vaccinations:TDap and Covid Other Comments:  None  Results for orders placed or performed during the hospital encounter of 12/14/23 (from the past 24 hours)  Type and screen   Collection Time: 12/14/23  4:54 PM  Result Value Ref Range   ABO/RH(D) O POS    Antibody Screen NEG    Sample Expiration      12/17/2023,2359 Performed at [facility], [address], [place], [place] [address]   CBC   Collection Time: 12/14/23  4:55 PM  Result Value Ref Range   WBC 9.4 4.0 - 10.5 K/uL   RBC 4.06 3.87 - 5.11 MIL/uL   Hemoglobin 11.5 (L) 12.0 - 15.0 g/dL   HCT 65.7 (L) 63.9 - 53.9 %   MCV 84.2 80.0 - 100.0 fL   MCH 28.3 26.0 - 34.0 pg   MCHC 33.6 30.0 - 36.0 g/dL   RDW 86.9 88.4 - 84.4 %   Platelets 221 150 - 400 K/uL   nRBC 0.0 0.0 - 0.2 %    Patient Active Problem List   Diagnosis Date Noted   Post term pregnancy 12/14/2023   GBS (group B Streptococcus carrier), +RV culture, currently pregnant 11/24/2023   Supervision of high risk pregnancy, antepartum 05/19/2023   Generalized anxiety disorder 03/30/2023   Obesity affecting pregnancy 11/21/2020    Assessment/Plan:   MORGHAN KESTER is a 23 y.o. G3P2002 at [redacted]w[redacted]d here for IOL for NRFHT  #Labor: IOL with pitocin  for post dates with decreased variability on FHT in clinic #Pain: Epidural Per patient request #FWB: Category I #GBS status:  positive #Feeding: Breastmilk  and Formula #Reproductive Life planning: Depo Provera  #Circ:  not applicable   Vernell DELENA School, MD  12/14/2023, 6:11 PM  _________________________________________________________________________ I personally reviewed this patient's obstetric history, interviewed her, and completed an examination. I agree with the plan above which was discussed with the resident.  She reports no complications with her prior deliveries. Asymptomatic prior to presentation. Presenting for IOL due to concerning NST at the clinic.  Not currently taking medication besides prenatals (Sertraline  on her chart).  FHT currently category I, tolerating pitocin . Pt beginning to feel ctx.  Leeroy Pouch, MD

## 2023-12-14 NOTE — Progress Notes (Signed)
   PRENATAL VISIT NOTE  Subjective:  Jamie Escobar is a 23 y.o. G3P2002 at [redacted]w[redacted]d being seen today for ongoing prenatal care.  She is currently monitored for the following issues for this high-risk pregnancy and has Obesity affecting pregnancy; Supervision of high risk pregnancy, antepartum; Generalized anxiety disorder; and GBS (group B Streptococcus carrier), +RV culture, currently pregnant on their problem list.  Patient reports no complaints.  Contractions: Irregular. Vag. Bleeding: None.  Movement: Present. Denies leaking of fluid.   The following portions of the patient's history were reviewed and updated as appropriate: allergies, current medications, past family history, past medical history, past social history, past surgical history and problem list.   Objective:    Vitals:   12/14/23 1451  BP: 116/76  Pulse: 87  Weight: 191 lb (86.6 kg)    Fetal Status:  Fetal Heart Rate (bpm): NST   Movement: Present    General: Alert, oriented and cooperative. Patient is in no acute distress.  Skin: Skin is warm and dry. No rash noted.   Cardiovascular: Normal heart rate noted  Respiratory: Normal respiratory effort, no problems with respiration noted  Abdomen: Soft, gravid, appropriate for gestational age.  Pain/Pressure: Present     Pelvic: Cervical exam deferred        Extremities: Normal range of motion.     Mental Status: Normal mood and affect. Normal behavior. Normal judgment and thought content.   Assessment and Plan:  Pregnancy: G3P2002 at [redacted]w[redacted]d 1. Non-stress test nonreactive (Primary) 2. Post term pregnancy over 40 weeks Non reactive NST today, even though the ultrasound BPP was 8/8. Given NRNST, discussed indication for delivery. [facility] providers and staff notified.  Please refer to After Visit Summary for other counseling recommendations.   Return for Postpartum check.  Future Appointments  Date Time Provider Department Center  12/14/2023  3:30 PM Jamie Gloris DELENA, MD [facility]  12/18/2023 12:00 AM [facility] SCHED ROOM [facility] None    Gloris Herchel, MD

## 2023-12-14 NOTE — Progress Notes (Signed)

## 2023-12-14 NOTE — Progress Notes (Addendum)
 Patient ID: Jamie Escobar Saliva, female   DOB: 27-Sep-2000, 23 y.o.   MRN: [id number]   Subjective: -Care assumed of 23 y.o. G3P2002 at [redacted]w[redacted]d who presents for IOL s/t NR NST. In room to meet acquaintance of patient and family.  Patient in bathroom. Reports some contractions, but coping well.   Objective: BP 129/76   Pulse 98   Temp 98 F (36.7 C) (Oral)   Resp 18   Ht 5' 1.5 (1.562 m)   Wt 85.3 kg   LMP 03/06/2023   BMI 34.95 kg/m  No intake/output data recorded. No intake/output data recorded.  Fetal Monitoring: FHT: 150 bpm, Mod Var, -Decels, +Accels UC:   Q2-47min, palpates moderate  Physical Exam: General appearance: alert, well appearing, and in no distress. Chest: normal rate and regular rhythm.  clear to auscultation, no wheezes, rales or rhonchi, symmetric air entry. Abdominal exam: Gravid, Appears AGA, Soft RT. Extremities: No edema Skin exam: Warm Dry  Vaginal Exam: SVE:   Dilation: 2 Effacement (%): 50 Station: -2 Exam by:: stone rnc Membranes: Intact Internal Monitors: None  Augmentation/Induction: Pitocin :1mUn/min Cytotec : None  Assessment:  IUP at 40.3 weeks Cat I FT  IOL d/t NR NST GBS Positive  Plan: -Discussed AROM after completion of next PCN dose.  -Discussed AROM r/b including increased risk of infection, cord prolapse, fetal intolerance, and decreased labor time. No questions or concerns. -Continue other mgmt as ordered  Jamie Escobar Jamie Escobar, CNM 12/14/2023, 8:32 PM

## 2023-12-15 ENCOUNTER — Inpatient Hospital Stay (HOSPITAL_COMMUNITY): Admitting: Anesthesiology

## 2023-12-15 ENCOUNTER — Encounter (HOSPITAL_COMMUNITY): Payer: Self-pay | Admitting: Obstetrics & Gynecology

## 2023-12-15 DIAGNOSIS — O480 Post-term pregnancy: Secondary | ICD-10-CM

## 2023-12-15 DIAGNOSIS — Z3A40 40 weeks gestation of pregnancy: Secondary | ICD-10-CM

## 2023-12-15 DIAGNOSIS — O99214 Obesity complicating childbirth: Secondary | ICD-10-CM

## 2023-12-15 DIAGNOSIS — O99344 Other mental disorders complicating childbirth: Secondary | ICD-10-CM

## 2023-12-15 DIAGNOSIS — O99820 Streptococcus B carrier state complicating pregnancy: Secondary | ICD-10-CM

## 2023-12-15 DIAGNOSIS — O368330 Maternal care for abnormalities of the fetal heart rate or rhythm, third trimester, not applicable or unspecified: Secondary | ICD-10-CM

## 2023-12-15 LAB — RPR: RPR Ser Ql: NONREACTIVE

## 2023-12-15 MED ORDER — ONDANSETRON HCL 4 MG PO TABS: 4.0000 mg | ORAL_TABLET | ORAL | Status: DC | PRN

## 2023-12-15 MED ORDER — ZOLPIDEM TARTRATE 5 MG PO TABS: 5.0000 mg | ORAL_TABLET | Freq: Every evening | ORAL | Status: DC | PRN

## 2023-12-15 MED ORDER — ACETAMINOPHEN 325 MG PO TABS
650.0000 mg | ORAL_TABLET | ORAL | Status: DC | PRN
  Administered 2023-12-15 (×2): 650 mg via ORAL
  Filled 2023-12-15 (×2): qty 2

## 2023-12-15 MED ORDER — SENNOSIDES-DOCUSATE SODIUM 8.6-50 MG PO TABS
2.0000 | ORAL_TABLET | ORAL | Status: DC
  Administered 2023-12-15 – 2023-12-16 (×2): 2 via ORAL
  Filled 2023-12-15 (×2): qty 2

## 2023-12-15 MED ORDER — SIMETHICONE 80 MG PO CHEW: 80.0000 mg | CHEWABLE_TABLET | ORAL | Status: DC | PRN

## 2023-12-15 MED ORDER — TETANUS-DIPHTH-ACELL PERTUSSIS 5-2.5-18.5 LF-MCG/0.5 IM SUSY: 0.5000 mL | PREFILLED_SYRINGE | Freq: Once | INTRAMUSCULAR | Status: DC

## 2023-12-15 MED ORDER — PRENATAL MULTIVITAMIN CH
1.0000 | ORAL_TABLET | Freq: Every day | ORAL | Status: DC
  Administered 2023-12-15 – 2023-12-16 (×2): 1 via ORAL
  Filled 2023-12-15 (×2): qty 1

## 2023-12-15 MED ORDER — SERTRALINE HCL 50 MG PO TABS: 50.0000 mg | ORAL_TABLET | Freq: Every day | ORAL | Status: DC

## 2023-12-15 MED ORDER — COCONUT OIL OIL: 1.0000 | TOPICAL_OIL | Status: DC | PRN

## 2023-12-15 MED ORDER — ONDANSETRON HCL 4 MG/2ML IJ SOLN: 4.0000 mg | INTRAMUSCULAR | Status: DC | PRN

## 2023-12-15 MED ORDER — BENZOCAINE-MENTHOL 20-0.5 % EX AERO: 1.0000 | INHALATION_SPRAY | CUTANEOUS | Status: DC | PRN

## 2023-12-15 MED ORDER — WITCH HAZEL-GLYCERIN EX PADS: 1.0000 | MEDICATED_PAD | CUTANEOUS | Status: DC | PRN

## 2023-12-15 MED ORDER — LIDOCAINE HCL (PF) 1 % IJ SOLN
INTRAMUSCULAR | Status: DC | PRN
  Administered 2023-12-15: 11 mL via EPIDURAL

## 2023-12-15 MED ORDER — DIPHENHYDRAMINE HCL 25 MG PO CAPS: 25.0000 mg | ORAL_CAPSULE | Freq: Four times a day (QID) | ORAL | Status: DC | PRN

## 2023-12-15 MED ORDER — MEDROXYPROGESTERONE ACETATE 150 MG/ML IM SUSP: 150.0000 mg | INTRAMUSCULAR | Status: DC | PRN

## 2023-12-15 MED ORDER — DIBUCAINE (PERIANAL) 1 % EX OINT: 1.0000 | TOPICAL_OINTMENT | CUTANEOUS | Status: DC | PRN

## 2023-12-15 MED ORDER — IBUPROFEN 600 MG PO TABS
600.0000 mg | ORAL_TABLET | Freq: Four times a day (QID) | ORAL | Status: DC
  Administered 2023-12-15 – 2023-12-16 (×6): 600 mg via ORAL
  Filled 2023-12-15 (×6): qty 1

## 2023-12-15 NOTE — Anesthesia Postprocedure Evaluation (Signed)
 Anesthesia Post Note  Patient: Jamie Escobar  Procedure(s) Performed: AN AD HOC LABOR EPIDURAL     Patient location during evaluation: Mother Baby Anesthesia Type: Epidural Level of consciousness: awake Pain management: satisfactory to patient Vital Signs Assessment: post-procedure vital signs reviewed and stable Respiratory status: spontaneous breathing Cardiovascular status: stable Anesthetic complications: no   No notable events documented.  Last Vitals:  Vitals:   12/15/23 0628 12/15/23 1000  BP: 104/61 (!) 107/59  Pulse: 74 67  Resp: 18 16  Temp: 36.9 C 36.7 C  SpO2: 98% 99%    Last Pain:  Vitals:   12/15/23 1135  TempSrc:   PainSc: 5    Pain Goal:                   [employer]

## 2023-12-15 NOTE — Lactation Note (Signed)
 This note was copied from a baby's chart. Lactation Consultation Note  Patient Name: Jamie Escobar Unijb'd Date: 12/15/2023 Age:24 hours Reason for consult: Initial assessment;Term  P3, Mother is experienced with breastfeeding and states baby has been latching well. Feed on demand with cues.  Goal 8-12+ times per day after first 24 hrs.  Place baby STS if not cueing.  Mother does not have questions or concerns at this time. Suggest calling for help as needed.  Maternal Data Has patient been taught Hand Expression?: Yes Does the patient have breastfeeding experience prior to this delivery?: Yes How long did the patient breastfeed?: 6 mos  Feeding Mother's Current Feeding Choice: Breast Milk Interventions Interventions: Education;Breast feeding basics reviewed;LC Services brochure;CDC milk storage guidelines  Discharge Pump: Personal;DEBP  Consult Status Consult Status: Follow-up Date: 12/16/23 Follow-up type: In-patient    Shannon Dines [facility] 12/15/2023, 9:01 AM

## 2023-12-15 NOTE — Discharge Summary (Signed)
 Postpartum Discharge Summary  Date of Service updated***     Patient Name: Jamie Escobar DOB: 25-Dec-2000 MRN: [id number]  Date of admission: 12/14/2023 Delivery date:12/15/2023 Delivering provider: SYNTHIA RAISIN Date of discharge: 12/15/2023  Admitting diagnosis: Post term pregnancy [O48.0] Intrauterine pregnancy: [redacted]w[redacted]d     Secondary diagnosis:  Principal Problem:   Vaginal delivery Active Problems:   Post term pregnancy  Additional problems: ***    Discharge diagnosis: {DX.:23714}                                              Post partum procedures:{Postpartum procedures:23558} Augmentation: {Augmentation:20782} Complications: {OB Labor/Delivery Complications:20784}  Hospital course: Induction of Labor With Vaginal Delivery   23 y.o. yo G3P3003 at [redacted]w[redacted]d was admitted to the hospital 12/14/2023 for induction of labor.  Indication for induction: Postdates and NR NST.  Patient had an labor course that was uncomplicated. Membrane Rupture Time/Date: 11:34 PM,12/14/2023  Delivery Method:Vaginal, Spontaneous Operative Delivery:N/A Episiotomy: None Lacerations:  None Details of delivery can be found in separate delivery note.  Patient had a postpartum course complicated by***. Patient is discharged home 12/15/23.  Newborn Data: Birth date:12/15/2023 Birth time:3:09 AM Gender:Female Living status:Living Apgars:8 ,9  Weight:3330 g  Magnesium  Sulfate received: No BMZ received: No Rhophylac:N/A MMR:N/A T-DaP:Given prenatally Flu: Yes RSV Vaccine received: No Transfusion:{Transfusion received:30440034}  Immunizations received: Immunization History  Administered Date(s) Administered   DTaP 08/12/1999, 10/25/1999, 12/10/1999, 11/26/2000, 11/14/2003   HIB (PRP-T) 08/12/1999, 10/25/1999, 12/10/1999, 11/26/2000   HPV Quadrivalent 01/02/2011, 08/14/2012, 11/08/2013   Hepatitis A, Ped/Adol-2 Dose 11/16/2007, 09/07/2009   Hepatitis B, PED/ADOLESCENT 07/29/1999, 09/13/1999,  04/16/2000   IPV 08/12/1999, 10/25/1999, 06/12/2000, 11/14/2003   Influenza Nasal 02/23/2007   Influenza, Seasonal, Injecte, Preservative Fre 02/09/2023   Influenza,inj,Quad PF,6+ Mos 05/10/2015, 01/29/2016, 02/21/2021   Influenza-Unspecified 05/10/2015, 01/29/2016, 03/21/2020   MMR 07/06/2000, 11/14/2003   Meningococcal Conjugate 01/02/2011   PFIZER Comirnaty(Gray Top)Covid-19 Tri-Sucrose Vaccine 09/25/2020   PFIZER(Purple Top)SARS-COV-2 Vaccination 08/02/2019, 08/25/2019, 03/21/2020, 04/11/2020   Pneumococcal Conjugate PCV 7 08/12/1999, 10/25/1999, 12/10/1999   Pneumococcal Conjugate-13 08/12/1999, 10/25/1999, 12/10/1999   Tdap 09/02/2010, 09/04/2015, 03/21/2021, 09/24/2023   Varicella 07/06/2000, 11/16/2007    Physical exam  Vitals:   12/15/23 0430 12/15/23 0445 12/15/23 0514 12/15/23 0628  BP: 105/66 107/65 117/74 104/61  Pulse: 93 91 81 74  Resp:   18 18  Temp:   98.7 F (37.1 C) 98.5 F (36.9 C)  TempSrc:   Oral Oral  SpO2:   99% 98%  Weight:      Height:       General: {Exam; general:21111117} Lochia: {Desc; appropriate/inappropriate:30686::appropriate} Uterine Fundus: {Desc; firm/soft:30687} Incision: {Exam; incision:21111123} DVT Evaluation: {Exam; dvt:2111122} Labs: Lab Results  Component Value Date   WBC 9.4 12/14/2023   HGB 11.5 (L) 12/14/2023   HCT 34.2 (L) 12/14/2023   MCV 84.2 12/14/2023   PLT 221 12/14/2023      Latest Ref Rng & Units 09/15/2023    4:56 PM  CMP  Glucose 70 - 99 mg/dL 89   BUN 6 - 20 mg/dL 9   Creatinine 9.55 - 8.99 mg/dL 9.47   Sodium 864 - 854 mmol/L 136   Potassium 3.5 - 5.1 mmol/L 4.0   Chloride 98 - 111 mmol/L 107   CO2 22 - 32 mmol/L 19   Calcium 8.9 - 10.3 mg/dL 8.8    [place]  Score:    06/09/2021    6:35 PM  Edinburgh Postnatal Depression Scale Screening Tool  I have been able to laugh and see the funny side of things. 0  I have looked forward with enjoyment to things. 0  I have blamed myself unnecessarily when  things went wrong. 0  I have been anxious or worried for no good reason. 0  I have felt scared or panicky for no good reason. 0  Things have been getting on top of me. 0  I have been so unhappy that I have had difficulty sleeping. 0  I have felt sad or miserable. 0  I have been so unhappy that I have been crying. 0  The thought of harming myself has occurred to me. 0  Edinburgh Postnatal Depression Scale Total 0      Data saved with a previous flowsheet row definition   No data recorded  After visit meds:  Allergies as of 12/15/2023   No Known Allergies   Med Rec must be completed prior to using this [facility]***        Discharge home in stable condition Infant Feeding: {Baby feeding:23562} Infant Disposition:{CHL IP OB HOME WITH FNUYZM:76418} Discharge instruction: per After Visit Summary and Postpartum booklet. Activity: Advance as tolerated. Pelvic rest for 6 weeks.  Diet: {OB ipzu:[id number]} Future Appointments:No future appointments. Follow up Visit: Message Sent 12/15/2023   Please schedule this patient for a Virtual postpartum visit in 6 weeks with the following provider: Any provider. Additional Postpartum F/U:Postpartum Depression checkup  Low risk pregnancy complicated by: None Delivery mode:  Vaginal, Spontaneous Anticipated Birth Control:  Depo   12/15/2023 Harlene LITTIE Duncans, CNM

## 2023-12-15 NOTE — Anesthesia Preprocedure Evaluation (Signed)
 Anesthesia Evaluation  Patient identified by MRN, date of birth, ID band Patient awake    Reviewed: Allergy & Precautions, Patient's Chart, lab work & pertinent test results  Airway Mallampati: II  TM Distance: >3 FB Neck ROM: Full    Dental no notable dental hx.    Pulmonary former smoker   Pulmonary exam normal breath sounds clear to auscultation       Cardiovascular negative cardio ROS Normal cardiovascular exam Rhythm:Regular Rate:Normal     Neuro/Psych   Anxiety     negative neurological ROS  negative psych ROS   GI/Hepatic Neg liver ROS,GERD  Medicated and Controlled,,  Endo/Other  negative endocrine ROS  BMI 38  Renal/GU negative Renal ROS  negative genitourinary   Musculoskeletal negative musculoskeletal ROS (+)    Abdominal   Peds negative pediatric ROS (+)  Hematology negative hematology ROS (+) hct 37.9, plt 208   Anesthesia Other Findings   Reproductive/Obstetrics (+) Pregnancy                              Anesthesia Physical Anesthesia Plan  ASA: 2  Anesthesia Plan: Epidural   Post-op Pain Management:    Induction:   PONV Risk Score and Plan: 2  Airway Management Planned: Natural Airway  Additional Equipment: None  Intra-op Plan:   Post-operative Plan:   Informed Consent: I have reviewed the patients History and Physical, chart, labs and discussed the procedure including the risks, benefits and alternatives for the proposed anesthesia with the patient or authorized representative who has indicated his/her understanding and acceptance.       Plan Discussed with:   Anesthesia Plan Comments:          Anesthesia Quick Evaluation

## 2023-12-15 NOTE — Anesthesia Procedure Notes (Signed)
 Epidural Patient location during procedure: OB Start time: 12/15/2023 12:19 AM End time: 12/15/2023 12:37 AM  Staffing Anesthesiologist: Cleotilde Butler Dade, MD Performed: anesthesiologist   Preanesthetic Checklist Completed: patient identified, IV checked, site marked, risks and benefits discussed, surgical consent, monitors and equipment checked, pre-op evaluation and timeout performed  Epidural Patient position: sitting Prep: ChloraPrep Patient monitoring: heart rate, cardiac monitor, continuous pulse ox and blood pressure Approach: midline Location: L2-L3 Injection technique: LOR saline  Needle:  Needle type: Tuohy  Needle gauge: 17 G Needle length: 9 cm Needle insertion depth: 7 cm Catheter type: closed end flexible Catheter size: 20 Guage Catheter at skin depth: 11 cm Test dose: negative  Assessment Events: blood not aspirated, injection not painful, no injection resistance, no paresthesia and negative IV test  Additional Notes Reason for block:procedure for pain

## 2023-12-16 ENCOUNTER — Other Ambulatory Visit (HOSPITAL_COMMUNITY): Payer: Self-pay

## 2023-12-16 LAB — CBC
HCT: 31.3 % — ABNORMAL LOW (ref 36.0–46.0)
Hemoglobin: 10.4 g/dL — ABNORMAL LOW (ref 12.0–15.0)
MCH: 28.2 pg (ref 26.0–34.0)
MCHC: 33.2 g/dL (ref 30.0–36.0)
MCV: 84.8 fL (ref 80.0–100.0)
Platelets: 173 K/uL (ref 150–400)
RBC: 3.69 MIL/uL — ABNORMAL LOW (ref 3.87–5.11)
RDW: 13.2 % (ref 11.5–15.5)
WBC: 6.7 K/uL (ref 4.0–10.5)
nRBC: 0 % (ref 0.0–0.2)

## 2023-12-16 MED ORDER — IBUPROFEN 600 MG PO TABS
600.0000 mg | ORAL_TABLET | Freq: Four times a day (QID) | ORAL | 0 refills | Status: AC
  Filled 2023-12-16: qty 30, 8d supply, fill #0

## 2023-12-16 MED ORDER — WITCH HAZEL-GLYCERIN EX PADS: 1.0000 | MEDICATED_PAD | CUTANEOUS | Status: AC | PRN

## 2023-12-16 MED ORDER — FERROUS SULFATE 325 (65 FE) MG PO TABS
325.0000 mg | ORAL_TABLET | Freq: Two times a day (BID) | ORAL | 3 refills | Status: AC
  Filled 2023-12-16: qty 60, 30d supply, fill #0

## 2023-12-16 MED ORDER — BENZOCAINE-MENTHOL 20-0.5 % EX AERO: 1.0000 | INHALATION_SPRAY | CUTANEOUS | Status: AC | PRN

## 2023-12-16 MED ORDER — ACETAMINOPHEN 325 MG PO TABS: 650.0000 mg | ORAL_TABLET | ORAL | Status: AC | PRN

## 2023-12-16 MED ORDER — COCONUT OIL OIL
1.0000 | TOPICAL_OIL | Status: AC | PRN
Start: 2023-12-16 — End: ?

## 2023-12-16 NOTE — Lactation Note (Signed)
 This note was copied from a baby's chart. Lactation Consultation Note  Patient Name: Jamie Escobar Unijb'd Date: 12/16/2023 Age:24 hours Reason for consult: Follow-up assessment;Term;Infant weight loss;Exclusive pumping and bottle feeding (5 % weight loss,) Per [facility]BU nurse and mom have decided to pump and bottle feed only.  LC reviewed supply and demand, engorgement prevention and tx, and mom aware of the [facility] resources.   Maternal Data Has patient been taught Hand Expression?: Yes Does the patient have breastfeeding experience prior to this delivery?: Yes  Feeding Mother's Current Feeding Choice: Breast Milk and Formula   Lactation Tools Discussed/Used Tools: Pump;Flanges (the [facility]BU nurse set up the DEBP this am) Flange Size: 24;Other (comment) (per mom the #24 F is comfortable) Breast pump type: Double-Electric Breast Pump Pump Education: Milk Storage;Setup, frequency, and cleaning  Interventions Interventions: Breast feeding basics reviewed;DEBP;Education;LC Services brochure;CDC milk storage guidelines;CDC Guidelines for Breast Pump Cleaning  Discharge Discharge Education: Engorgement and breast care;Warning signs for feeding baby Pump: DEBP;Personal  Consult Status Consult Status: Complete Date: 12/16/23    Rollene Jenkins Fiedler 12/16/2023, 1:22 PM

## 2023-12-16 NOTE — Patient Instructions (Signed)

## 2023-12-16 NOTE — Progress Notes (Signed)
 POSTPARTUM PROGRESS NOTE  Post Partum Day 1  Subjective:  Jamie Escobar is a 23 y.o. [phone] s/p SVD at [redacted]w[redacted]d.  She reports she is doing well. No acute events overnight. She denies any problems with ambulating, voiding or po intake. Denies nausea or vomiting.  Pain is well controlled.  Lochia is appropriate.  Objective: Blood pressure 133/78, pulse 85, temperature 97.6 F (36.4 C), temperature source Oral, resp. rate 18, height 5' 1.5 (1.562 m), weight 85.3 kg, last menstrual period 03/06/2023, SpO2 100%, unknown if currently breastfeeding.  Physical Exam:  General: alert, cooperative and no distress Chest: no respiratory distress Heart:regular rate, distal pulses intact Uterine Fundus: firm, appropriately tender DVT Evaluation: No calf swelling or tenderness Extremities: trace edema Skin: warm, dry  Recent Labs    12/14/23 1655 12/16/23 0532  HGB 11.5* 10.4*  HCT 34.2* 31.3*    Assessment/Plan: Jamie Escobar is a 23 y.o. [phone] s/p SVD at [redacted]w[redacted]d   PPD#1 - Doing well  Routine postpartum care ABLA mild, clinically significant, asymptomatic  Start  oral iron supplement Contraception: Depo-provera  Feeding: Formula Dispo: Plan for discharge today.   LOS: 2 days   Mardy Shropshire, MD OB Fellow  12/16/2023, 9:04 AM

## 2023-12-16 NOTE — Social Work (Signed)
 MOB was referred for history of depression/anxiety.  * Referral screened out by Clinical Social Worker because none of the following criteria appear to apply:  ~ History of anxiety/depression during this pregnancy, or of post-partum depression following prior delivery.  ~ Diagnosis of anxiety and/or depression within last 3 years OR * MOB's symptoms currently being treated with medication and/or therapy. Per chart review MOB is currently taking Zoloft 50mg , Edinburgh=0.  Please contact the Clinical Social Worker if needs arise, or by MOB request.   Eliazar Gave, LCSWA Clinical Social Worker [phone]

## 2023-12-18 ENCOUNTER — Inpatient Hospital Stay (HOSPITAL_COMMUNITY)

## 2023-12-22 ENCOUNTER — Ambulatory Visit (INDEPENDENT_AMBULATORY_CARE_PROVIDER_SITE_OTHER)

## 2023-12-22 DIAGNOSIS — F411 Generalized anxiety disorder: Secondary | ICD-10-CM

## 2023-12-22 DIAGNOSIS — Z1331 Encounter for screening for depression: Secondary | ICD-10-CM | POA: Diagnosis not present

## 2023-12-22 NOTE — Progress Notes (Signed)
 Jamie Escobar  [id number] here for postpartum depression screen. Pt is currently 0 weeks postpartum. Pt reports doing well and happy.   Edinburgh Postnatal Depression Scale - 12/22/23 1443       Edinburgh Postnatal Depression Scale:  In the Past 7 Days   I have been able to laugh and see the funny side of things. 0    I have looked forward with enjoyment to things. 0    I have blamed myself unnecessarily when things went wrong. 0    I have been anxious or worried for no good reason. 0    I have felt scared or panicky for no good reason. 0    Things have been getting on top of me. 0    I have been so unhappy that I have had difficulty sleeping. 0    I have felt sad or miserable. 0    I have been so unhappy that I have been crying. 0    The thought of harming myself has occurred to me. 0    Edinburgh Postnatal Depression Scale Total 0            Discussed with provider results of Edinburgh.  Pt to follow up With scheduled PPV .

## 2023-12-29 ENCOUNTER — Telehealth (HOSPITAL_COMMUNITY): Payer: Self-pay

## 2023-12-29 NOTE — Telephone Encounter (Signed)
 12/29/2023 1955  Name: Jamie Escobar MRN: [id number] DOB: 28-Jan-2001  Reason for Call:  Transition of Care Hospital Discharge Call  Contact Status: Patient Contact Status: Complete  Language assistant needed:          Follow-Up Questions: Do You Have Any Concerns About Your Health As You Heal From Delivery?: No Do You Have Any Concerns About Your Infants Health?: No  Edinburgh Postnatal Depression Scale:  In the Past 7 Days: I have been able to laugh and see the funny side of things.: As much as I always could I have looked forward with enjoyment to things.: As much as I ever did I have blamed myself unnecessarily when things went wrong.: No, never I have been anxious or worried for no good reason.: No, not at all I have felt scared or panicky for no good reason.: No, not at all Things have been getting on top of me.: No, I have been coping as well as ever I have been so unhappy that I have had difficulty sleeping.: Not at all I have felt sad or miserable.: No, not at all I have been so unhappy that I have been crying.: No, never The thought of harming myself has occurred to me.: Never Van Postnatal Depression Scale Total: 0  PHQ2-9 Depression Scale:     Discharge Follow-up: Edinburgh score requires follow up?: No Patient was advised of the following resources:: Breastfeeding Support Group, Support Group  Post-discharge interventions: Reviewed Newborn Safe Sleep Practices  Signature  Rosaline Deretha PEAK

## 2024-01-25 ENCOUNTER — Ambulatory Visit: Admitting: Obstetrics and Gynecology

## 2024-01-25 ENCOUNTER — Other Ambulatory Visit (HOSPITAL_COMMUNITY)
Admission: RE | Admit: 2024-01-25 | Discharge: 2024-01-25 | Disposition: A | Discharge status: Home / Self Care | Source: Ambulatory Visit | Attending: Obstetrics and Gynecology | Admitting: Obstetrics and Gynecology

## 2024-01-25 VITALS — BP 115/75 | HR 69 | Wt 178.0 lb

## 2024-01-25 DIAGNOSIS — Z124 Encounter for screening for malignant neoplasm of cervix: Secondary | ICD-10-CM | POA: Diagnosis present

## 2024-01-25 NOTE — Progress Notes (Unsigned)
    Post Partum Visit Note  Jamie Escobar is a 23 y.o. [id number] s/p August 5th SVD/intact perineum after IOL at 40wks after non reactive NST during post dates testing.  Anesthesia: epidural. Postpartum course has been uncomplicated. Baby is doing well. Baby is breast feeding or pumping only. She does supplement with formula but still pumps when she supplements. Bleeding no bleeding. Bowel function is normal. Bladder function is normal. Patient is not sexually active. Contraception method is abstinence. Postpartum depression screening: negative.   Edinburgh Postnatal Depression Scale - 01/25/24 1355       Edinburgh Postnatal Depression Scale:  In the Past 7 Days   I have been able to laugh and see the funny side of things. 0    I have looked forward with enjoyment to things. 0    I have blamed myself unnecessarily when things went wrong. 0    I have been anxious or worried for no good reason. 0    I have felt scared or panicky for no good reason. 0    Things have been getting on top of me. 0    I have been so unhappy that I have had difficulty sleeping. 0    I have felt sad or miserable. 0    I have been so unhappy that I have been crying. 0    The thought of harming myself has occurred to me. 0    Edinburgh Postnatal Depression Scale Total 0         Review of Systems Pertinent items are noted in HPI.  Objective:  LMP 03/06/2023   ***  General: NAD Pelvic exam: normal external genitalia, vulva, vagina, cervix, uterus and adnexa.  Assessment:   Normal postpartum exam  Plan:  Routine postpartum care. Patient to continue with abstinence/lactational amenorrhea, which was d/w her.   RTC PRN  [name], MD [facility] [employer], [facility]

## 2024-01-26 LAB — CYTOLOGY - PAP: Diagnosis: NEGATIVE

## 2024-01-27 ENCOUNTER — Ambulatory Visit: Payer: Self-pay | Admitting: Obstetrics and Gynecology

## 2024-06-09 ENCOUNTER — Ambulatory Visit
Admission: EM | Admit: 2024-06-09 | Discharge: 2024-06-09 | Disposition: A | Discharge status: Home / Self Care | Attending: Emergency Medicine | Admitting: Emergency Medicine

## 2024-06-09 DIAGNOSIS — B349 Viral infection, unspecified: Secondary | ICD-10-CM

## 2024-06-09 LAB — POC COVID19/FLU A&B COMBO
Covid Antigen, POC: NEGATIVE
Influenza A Antigen, POC: NEGATIVE
Influenza B Antigen, POC: NEGATIVE

## 2024-06-09 NOTE — ED Triage Notes (Signed)
 Body aches, chills that started last night. Pt states her kids are currently positive for flu A.

## 2024-06-09 NOTE — ED Provider Notes (Signed)
 " [facility]    CSN: [id number] Arrival date & time: 06/09/24  1101      History   Chief Complaint Chief Complaint  Patient presents with   Generalized Body Aches   Chills    HPI Jamie Escobar is a 24 y.o. female.  Patient presents with chills, body aches, diarrhea since last night.  2 episodes of diarrhea.  No fever, ear pain, sore throat, cough, shortness of breath, abdominal pain, nausea, vomiting.  No OTC medications taken today.  Patient states her children currently have the flu.  The history is provided by the patient and medical records.    Past Medical History:  Diagnosis Date   Chronic rhinitis 02/01/2020   Eczema    Hydronephrosis    Menorrhagia with irregular cycle 10/23/2017   Skin lesion 10/23/2017   Urticaria     Patient Active Problem List   Diagnosis Date Noted   Generalized anxiety disorder 03/30/2023    Past Surgical History:  Procedure Laterality Date   FOOT SURGERY Right     OB History     Gravida  3   Para  3   Term  3   Preterm      AB      Living  3      SAB      IAB      Ectopic      Multiple  0   Live Births  3            Home Medications    Prior to Admission medications  Medication Sig Start Date End Date Taking? Authorizing Provider  acetaminophen  (TYLENOL ) 325 MG tablet Take 2 tablets (650 mg total) by mouth every 4 (four) hours as needed (for pain scale < 4). 12/16/23   Leveque, Alyssa, MD  benzocaine -Menthol  (DERMOPLAST) 20-0.5 % AERO Apply 1 Application topically as needed for irritation (perineal discomfort). 12/16/23   Leveque, Alyssa, MD  coconut oil OIL Apply 1 Application topically as needed. 12/16/23   Leveque, Alyssa, MD  ferrous sulfate  325 (65 FE) MG tablet Take 1 tablet (325 mg total) by mouth 2 (two) times daily. 12/16/23 12/15/24  Leveque, Alyssa, MD  ibuprofen  (ADVIL ) 600 MG tablet Take 1 tablet (600 mg total) by mouth every 6 (six) hours. 12/16/23   Leveque, Alyssa, MD  sertraline   (ZOLOFT ) 50 MG tablet Take 50 mg by mouth daily.    [provider]  witch hazel-glycerin  (TUCKS) pad Apply 1 Application topically as needed for hemorrhoids. 12/16/23   Loyola Fish, MD    Family History Family History  Problem Relation Age of Onset   Cancer Father        brain    Diabetes Maternal Grandmother    Heart disease Maternal Grandmother    Allergic rhinitis Neg Hx    Angioedema Neg Hx    Asthma Neg Hx    Atopy Neg Hx    Eczema Neg Hx    Immunodeficiency Neg Hx    Urticaria Neg Hx     Social History Social History[1]   Allergies   Patient has no known allergies.   Review of Systems Review of Systems  Constitutional:  Positive for chills. Negative for fever.  HENT:  Negative for ear pain and sore throat.   Respiratory:  Negative for cough and shortness of breath.   Gastrointestinal:  Positive for diarrhea. Negative for abdominal pain, nausea and vomiting.     Physical Exam Triage Vital Signs ED  Triage Vitals  Encounter Vitals Group     BP 06/09/24 1143 122/75     Girls Systolic BP Percentile --      Girls Diastolic BP Percentile --      Boys Systolic BP Percentile --      Boys Diastolic BP Percentile --      Pulse Rate 06/09/24 1143 78     Resp 06/09/24 1143 18     Temp 06/09/24 1143 97.8 F (36.6 C)     Temp src --      SpO2 06/09/24 1143 98 %     Weight --      Height --      Head Circumference --      Peak Flow --      Pain Score 06/09/24 1159 6     Pain Loc --      Pain Education --      Exclude from Growth Chart --    No data found.  Updated Vital Signs BP 122/75   Pulse 78   Temp 97.8 F (36.6 C)   Resp 18   LMP 05/31/2024 (Approximate)   SpO2 98%   Breastfeeding No   Visual Acuity Right Eye Distance:   Left Eye Distance:   Bilateral Distance:    Right Eye Near:   Left Eye Near:    Bilateral Near:     Physical Exam Constitutional:      General: She is not in acute distress. HENT:     Right Ear: Tympanic  membrane normal.     Left Ear: Tympanic membrane normal.     Nose: Nose normal.     Mouth/Throat:     Mouth: Mucous membranes are moist.     Pharynx: Oropharynx is clear.  Cardiovascular:     Rate and Rhythm: Normal rate and regular rhythm.     Heart sounds: Normal heart sounds.  Pulmonary:     Effort: Pulmonary effort is normal. No respiratory distress.     Breath sounds: Normal breath sounds.  Abdominal:     General: Bowel sounds are normal.     Palpations: Abdomen is soft.     Tenderness: There is no abdominal tenderness. There is no guarding or rebound.  Neurological:     Mental Status: She is alert.      UC Treatments / Results  Labs (all labs ordered are listed, but only abnormal results are displayed) Labs Reviewed  POC COVID19/FLU A&B COMBO    EKG   Radiology No results found.  Procedures Procedures (including critical care time)  Medications Ordered in UC Medications - No data to display  Initial Impression / Assessment and Plan / UC Course  I have reviewed the triage vital signs and the nursing notes.  Pertinent labs & imaging results that were available during my care of the patient were reviewed by me and considered in my medical decision making (see chart for details).    Viral illness.  Afebrile and vital signs are stable.  Patient has been symptomatic since last night.  Her children currently have the flu.  Patient's flu and COVID test are negative at this time but discussed with patient that she may be too early in her symptoms and will need to recheck her flu test tomorrow at home.  Discussed symptomatic treatment including Tylenol  or ibuprofen  as needed, plain Mucinex as needed, rest, hydration.  Instructed her to follow-up with her PCP if she is not improving.  Education provided on viral  illness.  She agrees to plan of care.  Final Clinical Impressions(s) / UC Diagnoses   Final diagnoses:  Viral illness     Discharge Instructions      The  COVID and flu tests are negative.   Take Tylenol  or ibuprofen  as needed for fever or discomfort.  Take plain Mucinex as needed for congestion.  Rest and keep yourself hydrated.    Follow-up with your primary care provider if your symptoms are not improving.         ED Prescriptions   None    PDMP not reviewed this encounter.    [1]  Social History Tobacco Use   Smoking status: Former    Types: E-cigarettes   Smokeless tobacco: Never  Vaping Use   Vaping status: Former   Substances: Nicotine  Substance Use Topics   Alcohol use: No   Drug use: Not Currently    Types: Marijuana     Corlis Burnard DEL, NP 06/09/24 1235  "

## 2024-06-09 NOTE — Discharge Instructions (Addendum)
 The COVID and flu tests are negative.   Take Tylenol or ibuprofen as needed for fever or discomfort.  Take plain Mucinex as needed for congestion.  Rest and keep yourself hydrated.    Follow-up with your primary care provider if your symptoms are not improving.
